# Patient Record
Sex: Female | Born: 1985 | Race: Black or African American | Hispanic: No | Marital: Single | Smoking: Current some day smoker
Health system: Southern US, Community
[De-identification: ages and names within clinical notes are randomized; demographics above are authoritative.]

## PROBLEM LIST (undated history)

## (undated) DIAGNOSIS — E282 Polycystic ovarian syndrome: Secondary | ICD-10-CM

## (undated) DIAGNOSIS — A749 Chlamydial infection, unspecified: Secondary | ICD-10-CM

## (undated) DIAGNOSIS — L409 Psoriasis, unspecified: Secondary | ICD-10-CM

## (undated) DIAGNOSIS — E119 Type 2 diabetes mellitus without complications: Secondary | ICD-10-CM

## (undated) DIAGNOSIS — A599 Trichomoniasis, unspecified: Secondary | ICD-10-CM

## (undated) DIAGNOSIS — R87629 Unspecified abnormal cytological findings in specimens from vagina: Secondary | ICD-10-CM

## (undated) DIAGNOSIS — A549 Gonococcal infection, unspecified: Secondary | ICD-10-CM

## (undated) DIAGNOSIS — I10 Essential (primary) hypertension: Secondary | ICD-10-CM

## (undated) HISTORY — PX: CHOLECYSTECTOMY: SHX55

## (undated) HISTORY — DX: Chlamydial infection, unspecified: A74.9

## (undated) HISTORY — DX: Unspecified abnormal cytological findings in specimens from vagina: R87.629

## (undated) HISTORY — DX: Gonococcal infection, unspecified: A54.9

## (undated) HISTORY — DX: Polycystic ovarian syndrome: E28.2

## (undated) HISTORY — DX: Psoriasis, unspecified: L40.9

## (undated) HISTORY — DX: Type 2 diabetes mellitus without complications: E11.9

## (undated) HISTORY — DX: Essential (primary) hypertension: I10

## (undated) HISTORY — DX: Trichomoniasis, unspecified: A59.9

---

## 2004-07-30 ENCOUNTER — Other Ambulatory Visit: Admission: RE | Admit: 2004-07-30 | Discharge: 2004-07-30 | Payer: Self-pay | Admitting: Obstetrics and Gynecology

## 2006-01-27 ENCOUNTER — Emergency Department (HOSPITAL_COMMUNITY): Admission: EM | Admit: 2006-01-27 | Discharge: 2006-01-27 | Payer: Self-pay | Admitting: Emergency Medicine

## 2006-11-01 ENCOUNTER — Emergency Department (HOSPITAL_COMMUNITY): Admission: EM | Admit: 2006-11-01 | Discharge: 2006-11-01 | Payer: Self-pay | Admitting: Emergency Medicine

## 2006-11-03 ENCOUNTER — Ambulatory Visit (HOSPITAL_COMMUNITY): Admission: RE | Admit: 2006-11-03 | Discharge: 2006-11-03 | Payer: Self-pay

## 2006-11-05 ENCOUNTER — Inpatient Hospital Stay (HOSPITAL_COMMUNITY): Admission: AD | Admit: 2006-11-05 | Discharge: 2006-11-05 | Payer: Self-pay | Admitting: Obstetrics & Gynecology

## 2007-06-07 ENCOUNTER — Emergency Department (HOSPITAL_COMMUNITY): Admission: EM | Admit: 2007-06-07 | Discharge: 2007-06-07 | Payer: Self-pay | Admitting: Emergency Medicine

## 2007-06-20 ENCOUNTER — Ambulatory Visit (HOSPITAL_COMMUNITY): Admission: RE | Admit: 2007-06-20 | Discharge: 2007-06-20 | Payer: Self-pay | Admitting: Obstetrics & Gynecology

## 2007-07-18 ENCOUNTER — Ambulatory Visit (HOSPITAL_COMMUNITY): Admission: RE | Admit: 2007-07-18 | Discharge: 2007-07-18 | Payer: Self-pay | Admitting: Obstetrics & Gynecology

## 2007-08-15 ENCOUNTER — Ambulatory Visit (HOSPITAL_COMMUNITY): Admission: RE | Admit: 2007-08-15 | Discharge: 2007-08-15 | Payer: Self-pay | Admitting: Obstetrics & Gynecology

## 2007-09-10 ENCOUNTER — Ambulatory Visit (HOSPITAL_COMMUNITY): Admission: RE | Admit: 2007-09-10 | Discharge: 2007-09-10 | Payer: Self-pay | Admitting: Obstetrics & Gynecology

## 2007-10-23 ENCOUNTER — Other Ambulatory Visit: Admission: RE | Admit: 2007-10-23 | Discharge: 2007-10-23 | Payer: Self-pay | Admitting: Obstetrics & Gynecology

## 2007-10-24 ENCOUNTER — Ambulatory Visit: Payer: Self-pay | Admitting: Obstetrics and Gynecology

## 2007-10-24 ENCOUNTER — Inpatient Hospital Stay (HOSPITAL_COMMUNITY): Admission: AD | Admit: 2007-10-24 | Discharge: 2007-10-26 | Payer: Self-pay | Admitting: Obstetrics & Gynecology

## 2008-01-29 ENCOUNTER — Inpatient Hospital Stay (HOSPITAL_COMMUNITY): Admission: EM | Admit: 2008-01-29 | Discharge: 2008-02-01 | Payer: Self-pay | Admitting: Emergency Medicine

## 2008-01-29 ENCOUNTER — Ambulatory Visit: Payer: Self-pay | Admitting: Internal Medicine

## 2008-01-31 ENCOUNTER — Encounter (INDEPENDENT_AMBULATORY_CARE_PROVIDER_SITE_OTHER): Payer: Self-pay | Admitting: General Surgery

## 2008-11-17 ENCOUNTER — Ambulatory Visit (HOSPITAL_COMMUNITY): Admission: RE | Admit: 2008-11-17 | Discharge: 2008-11-17 | Payer: Self-pay | Admitting: Family Medicine

## 2010-08-31 IMAGING — CR DG MANDIBLE 4+V
7 series · 7 of 7 positions shown · non-contrast
Comparison: None available.

CLINICAL DATA: Pain and left side of jaw.  Status post trauma.

MANDIBLE - 4+ VIEW

[view not recorded (1 of 7)]
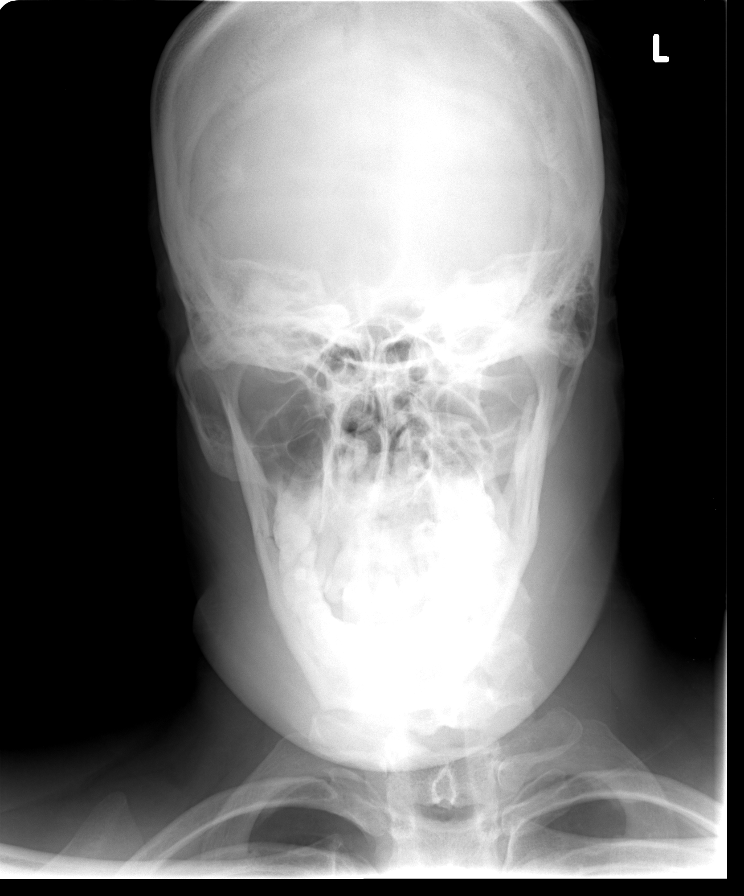

[view not recorded (2 of 7)]
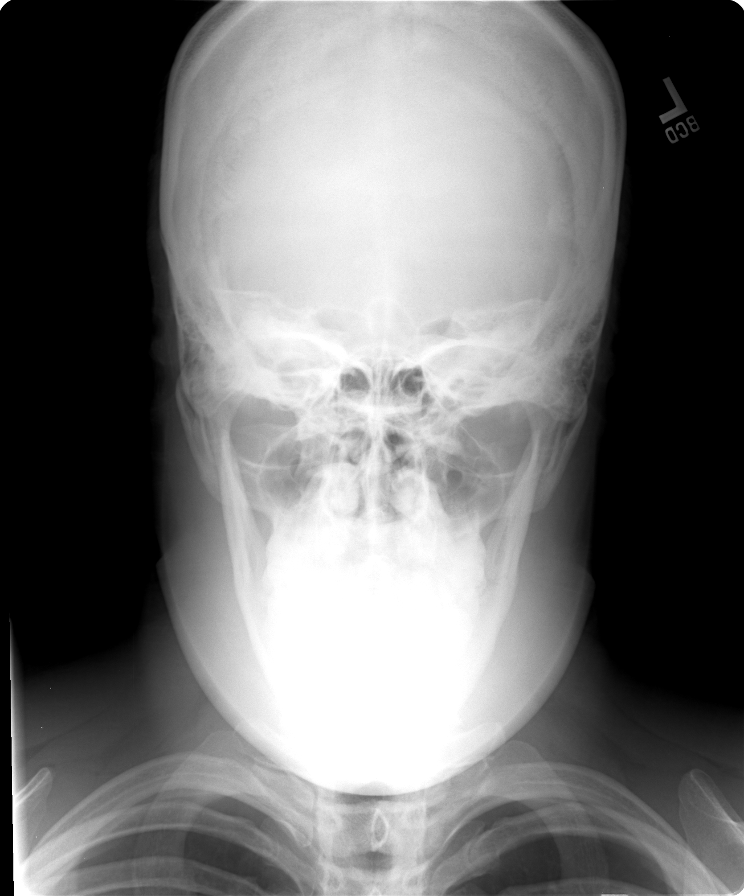

[view not recorded (3 of 7)]
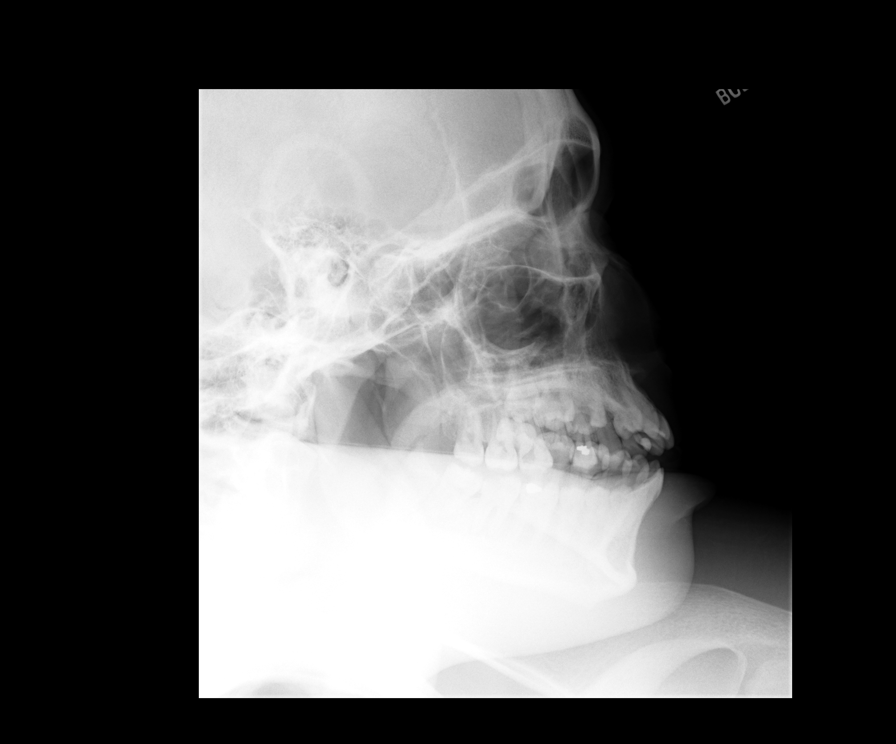

[view not recorded (4 of 7)]
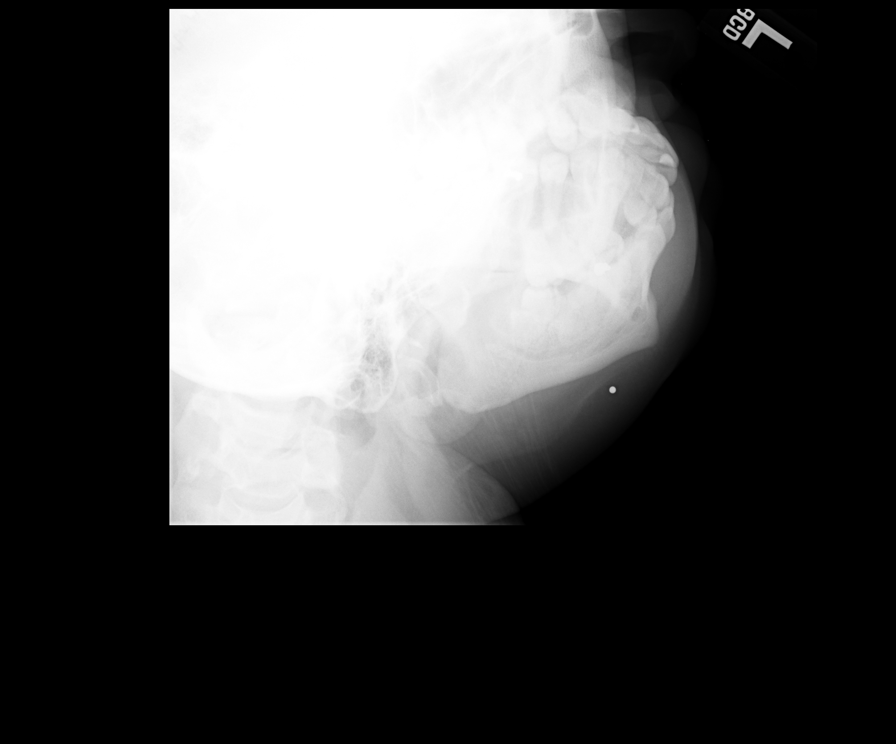

[view not recorded (5 of 7)]
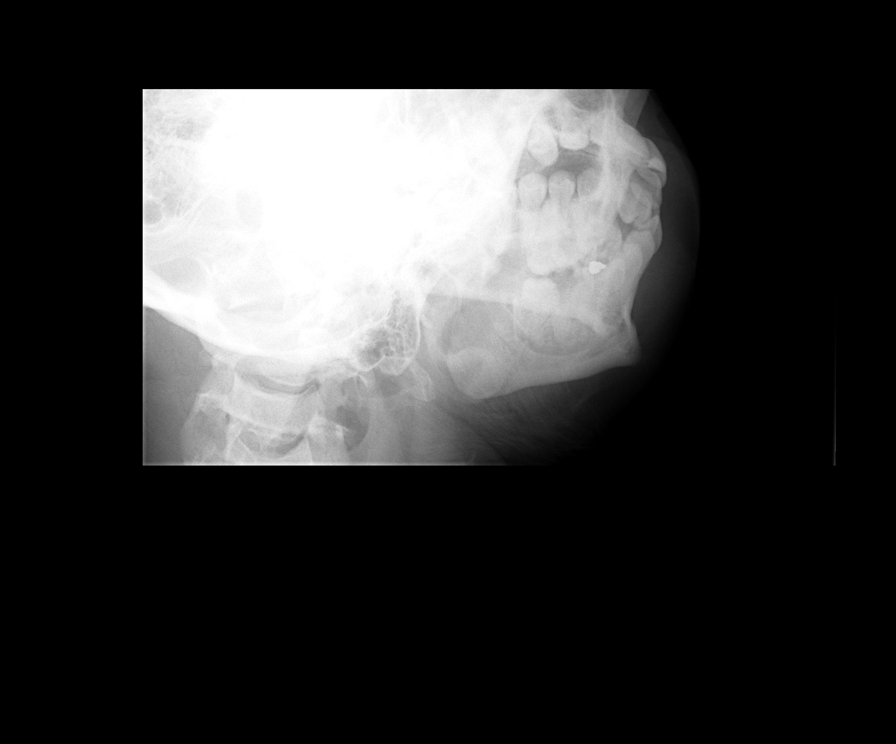

[view not recorded (6 of 7)]
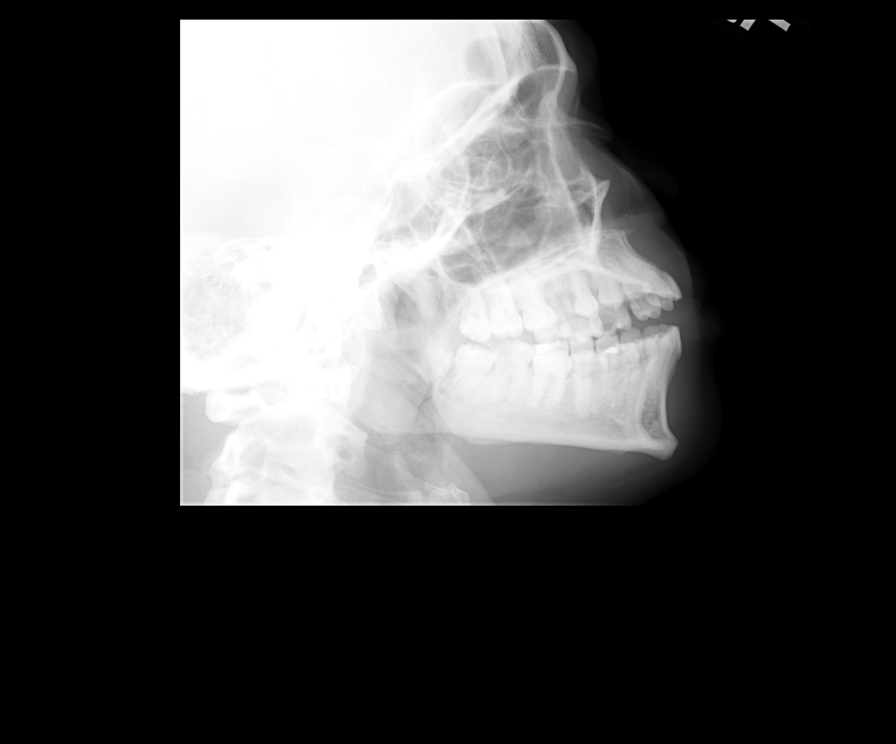

[view not recorded (7 of 7)]
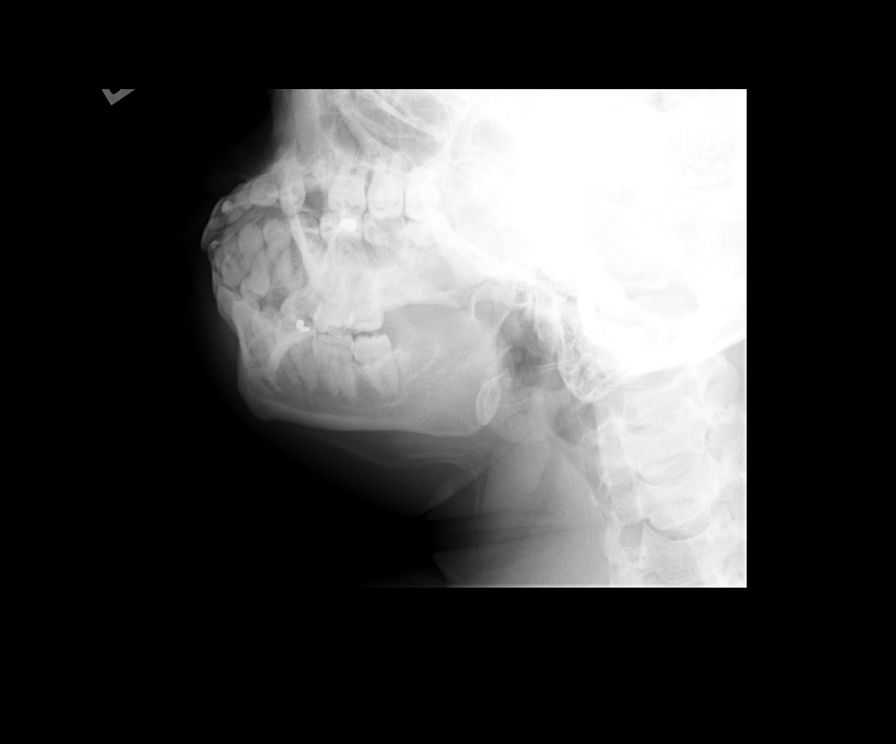

[7 of 7 positions shown; findings below may reference images not displayed]

FINDINGS: There is irregularity evident on the AP views
bilaterally.  This may be artifactual.  The study is limited due to
patient body habitus and technique.  No other definite fractures
seen.  Mandible is located bilaterally.
IMPRESSION: Linear irregularity at the angle mandible bilaterally on the AP
views is not confirmed on the other views.  Given the limitations
of the exam, CT may be of use for further evaluation if the
patient's pain persists.

## 2010-11-07 ENCOUNTER — Encounter: Payer: Self-pay | Admitting: Obstetrics & Gynecology

## 2010-11-07 ENCOUNTER — Encounter: Payer: Self-pay | Admitting: Family Medicine

## 2011-03-01 NOTE — H&P (Signed)
NAMEMARNEY, TRELOAR            ACCOUNT NO.:  [id number]   MEDICAL RECORD NO.:  [id number]          PATIENT TYPE:  INP   LOCATION:  A304                          FACILITY:  [facility]   PHYSICIAN:  Osvaldo Shipper, MD     DATE OF BIRTH:  09/20/1986   DATE OF ADMISSION:  01/29/2008  DATE OF DISCHARGE:  LH                              HISTORY & PHYSICAL   PRIMARY MEDICAL DOCTOR:  Dr. Simone Curia.   GYNECOLOGIST:  Dr. Emelda Fear.   ADMISSION DIAGNOSES:  1. Acute pancreatitis, possibly biliary in origin.  2. Obesity.   CHIEF COMPLAINT:  Abdominal pain since yesterday.   HISTORY OF PRESENT ILLNESS:  The patient is a 25 year old African  American female who is obese who delivered a baby girl in January of  this year.  She mentioned that through the last few months of her  pregnancy, she has been having on and off abdominal pains, especially in  the upper abdomen which I have been relieved by antacids.  Then she  experienced a couple more episodes of pain over the last couple of weeks  as well.  These symptoms progressively worsened yesterday when the pain  was 8-10/10 in intensity.  The pain was described as a dull aching pain  with radiation to the inferior part of her abdomen with no radiation to  the back.  There was no other associated symptoms of nausea and vomiting  x2.  No diarrhea.  No fever or chills.  This pain is not related to food  intake.  She took antacids with no relief.  No other precipitant,  aggravating or relieving factors were identified.  She says she continues to be sexually active, but she has an implant for  contraception.   MEDICATIONS AT HOME:  Currently none.   ALLERGIES:  NO KNOWN DRUG ALLERGIES.   PAST MEDICAL HISTORY:  She has polycystic ovarian syndrome.  Otherwise,  no other medical problems.  No surgeries in the past.   SOCIAL HISTORY:  Lives in [place] with her father.  She has one girl  child born in January of this year, normal delivery.   Half pack of  cigarettes on a daily basis is smoke by the patient.  She mentioned to  me that she has been drinking more than usual ever since her pregnancy.  She has been drinking about 4 days out of every 7 days in a week.  The  main consumption is beer with occasional vodka.  Her last alcohol  consumption was on Saturday.  Denied any illicit drug use.   FAMILY HISTORY:  Positive for gallbladder disease, otherwise, none.   REVIEW OF SYSTEMS:  GENERAL:  System positive for weakness, malaise.  HEENT:  Unremarkable.  CARDIOVASCULAR:  Remarkable.  RESPIRATORY:  Unremarkable.  GI: As in HPI.  GU: Unremarkable.  MUSCULOSKELETAL:  Unremarkable.  PSYCHIATRIC:  Unremarkable.  UROLOGICAL:  Unremarkable.  Other systems unremarkable.   PHYSICAL EXAMINATION:  VITAL SIGNS:  Temperature 97.0, blood pressure  116/65, heart rate 61, respiratory rate 18, saturation 99% on room air.  GENERAL:  An obese, African American female in no  distress.  HEENT:  There is no pallor, no icterus.  Oral mucous membranes moist.  No oral lesions are noted.  NECK:  Soft and supple.  No thyromegaly is appreciated.  LUNGS:  Clear to auscultation bilaterally.  No wheezing, rales or  rhonchi.  CARDIOVASCULAR:  S1, S2 normal, regular.  ABDOMEN:  Completely soft, benign, nontender, nondistended.  Bowel  sounds are present.  No mass or organomegaly is appreciated.  EXTREMITIES:  Show no edema.  Peripheral pulses are palpable.  NEUROLOGIC:  The patient is alert, oriented x3.  No focal neurologic  deficits are present.   LABORATORY DATA:  White count is normal hemoglobin 7.6, MCV is 85,  platelet count is 457.  C-met shows a glucose of 108, renal function was  normal.  AST is 198, ALT is 197.  Amylase is 958, lipase was 1745.   No imaging studies have been done.   ASSESSMENT:  This is a 25 year old African American female who is obese,  who is 3 months postpartum, who has currently having periods at this  point, who  presents with abdominal pain and is found to have evidence  for acute pancreatitis.  Etiology for this is likely gallstone  pancreatitis. She also has been consuming more alcohol than usual,  though at an amount which is less likely to cause pancreatitis, but it  is a possibility.   PLAN:  Acute pancreatitis.  We will admit the patient.  Start supportive  measures.  I will keep her n.p.o.  Get an ultrasound tomorrow which will  give more information on the gallbladder and will also evaluate the  pancreas.  Her obesity may impose problems with technicalities of the  ultrasound.  If that is the case, CT scan may be considered.  GI will be  consulted.  Dr. Gerda Diss will assume her care tomorrow.  She has been  counseled on alcohol use.  Her urine pregnancy test is pending.  Triglycerides will be checked as well.   Further management decisions will depend upon results of further testing  and patient's response to treatment.      Osvaldo Shipper, MD  Electronically Signed     GK/MEDQ  D:  01/29/2008  T:  01/29/2008  Job:  [id number]   cc:   Donna Bernard, M.D.  Fax: [phone]   Kassie Mends, M.D.  [address]  [place] , [place] [address]

## 2011-03-01 NOTE — Consult Note (Signed)
Tonya Bell, Tonya Bell            ACCOUNT NO.:  [id number]   MEDICAL RECORD NO.:  [id number]          PATIENT TYPE:  INP   LOCATION:  A304                          FACILITY:  [facility]   PHYSICIAN:  Tilford Pillar, MD      DATE OF BIRTH:  Mar 26, 1986   DATE OF CONSULTATION:  DATE OF DISCHARGE:                                 CONSULTATION   REASON FOR CONSULTATION:  1. Abdominal pain.  2. Cholelithiasis.  3. Acute pancreatitis.   HISTORY OF PRESENT ILLNESS:  The patient is an 25 year old African  American female who is recently postpartum  within the last 3 months who  presented with increasing epigastric and right upper quadrant pain.  She  has had similar symptomatology actually during her pregnancy in the  right upper quadrant, never this severe.  She had associated nausea.  She did not have any bowel changes noted with this.  She was admitted to  [facility] on January 29, 2008 for this pain for continued  management.  She was diagnosed acute pancreatitis and workup was  initiated.  At this point, her pain has actually resolved.  She denies  any epigastric pain.  She feels hungry.  She has had bowel function.  She is passing flatus.  She has no change in urination.  No dysuria.  No  hematuria.  She had no fever or chills.  She has no nausea.  In the  past, she has not had any history of jaundice.  She has never been  diagnosed previously with biliary disease.  She did state that her  symptoms did worsen with eating fatty or greasy foods, but the most  recent episode during her pregnancy worsened with green leafy vegetables  as well.  She does have a fairly significant family history of biliary  disease as well as she does have a history of Native American ancestry.   PAST MEDICAL HISTORY:  Unremarkable.   PAST SURGICAL HISTORY:  None.   MEDICATIONS:  She is not on any home medications.   ALLERGIES:  No known drug allergies.   SOCIAL HISTORY:  She is a half-a-pack a day  smoker.  She drinks  occasionally through the week, mainly beer.  No history of heavy alcohol  usage.  No recreational drug use.  She has not had any recent unusual  travels.   PHYSICAL EXAMINATION:  VITAL SIGNS:  Temperature is 98.3, heart rate 61,  respirations 20, and blood pressure 135/58.  GENERAL:  The patient actually walked into room prior to evaluation.  She does not appear to be in any acute distress.  She is easily able to  transition from standing to a supine position  in her  hospital bed.  She is alert and oriented x3.  HEENT:  Pupils equal, round, and reactive.  Extraocular movements are  intact.  No conjunctival pallor or scleral icterus noted.  Trachea is  midline.  NECK:  No cervical lymphadenopathy is appreciated.  PULMONARY:  Unlabored respirations.  She is clear to auscultation.  CARDIOVASCULAR:  Regular rate and rhythm.  ABDOMINAL:  Soft, obese, and nontender  to palpation.  She does not  elucidate Murphy sign.  She does not have any peritoneal signs.  No  masses or hernias are appreciated.  EXTREMITIES:  Warm and dry.   PERTINENT LABORATORY AND RADIOGRAPHIC STUDY:  CBC:  White blood cell  count 7.3, hemoglobin 11.2, hematocrit 34.4, and platelets 446.  Basic  metabolic panel:  Sodium 141, potassium 3.8, chloride 109, bicarb 27,  BUN 8, creatinine 0.91, and blood glucose 102.  Amylase 590, this is  down from 950 on her admission; lipase 396 down from 1700 on admission;  AST is elevated at 191; ALT is elevated 259; total bilirubin remaining  0.7, remainder of her laboratory evaluation is unremarkable.  She did  have a radiographic evaluation of the right upper quadrant demonstrating  a cholelithiasis.  There is no evidence of any acute cholecystitis or  pericholecystic fluid noted.   ASSESSMENT AND PLAN:  Acute pancreatitis.  At this point, based on the  patient's clinical presentation and history we suspect this is likely  gallstone  etiology of her  pancreatitis.  Based on these findings, I do  recommend continuing IV fluid hydration, continuing the patient in a  limited p.o. setting to avoid any exacerbation of the pancreatitis with  continued resolution of her pain and returns to normalization of her  amylase and lipase.  I will then recommend starting on clear liquids,  advancing as tolerated.  It was discussed with the patient the risks,  benefits, and alternatives of the laparoscopic possible open  cholecystectomy for further etiology of gallstone pancreatitis.  It was  discussed with the patient that I would recommend  to have this  performed prior to her discharge and that should she wish to proceed,  then we would advance her to a regular diet.  On demonstration of  tolerance of regular diet with no elevation of amylase and lipase for  return of her symptomatology at that point, she would then be placed in  n.p.o. status and scheduled  for an operation followup.   Discharge could be anticipated following her operation  pending  her  recovery from the procedure.  It was also discussed with the patient  possibility of an elective outpatient procedure, but again, the risk of  additional acute pancreatitis in the future could not be necessarily  avoided.  She wished to delay the cholecystectomy.  It was also  discussed with the patient her increased risk of having future bouts of  pancreatitis regardless of the gallbladder cholecystectomy based on the  pathophysiology of pancreatitis.  She understands this and understands  the benefits of cholecystectomy.  At this point, I will continue to  follow the patient's progress, and in case she continues to improve, as  she had over the last 24 hours, a laparoscopic cholecystectomy could  potentially occur in the next 48 to 72 hours.   I do appreciate the opportunity to participate in this patient's care.      Tilford Pillar, MD  Electronically Signed     BZ/MEDQ  D:  01/29/2008   T:  01/30/2008  Job:  [id number]   cc:   [facility]   W. Simone Curia, M.D.  Fax: [phone]

## 2011-03-01 NOTE — Op Note (Signed)
NAMEFOY, MUNGIA            ACCOUNT NO.:  [id number]   MEDICAL RECORD NO.:  [id number]          PATIENT TYPE:  INP   LOCATION:  A304                          FACILITY:  [facility]   PHYSICIAN:  Tilford Pillar, MD      DATE OF BIRTH:  06-May-1986   DATE OF PROCEDURE:  01/31/2008  DATE OF DISCHARGE:                               OPERATIVE REPORT   PREOPERATIVE DIAGNOSIS:  Gallstone pancreatitis.   POSTOPERATIVE DIAGNOSIS:  Gallstone pancreatitis.   PROCEDURES:  1. Laparoscopic cholecystectomy with intraoperative cholangiogram.  2. Intraoperative interpretation of fluoroscopy.   SURGEON:  Tilford Pillar, MD   ANESTHESIA:  General endotracheal local anesthetic, 1% Sensorcaine  plain.   SPECIMEN:  Gallbladder.   ESTIMATED BLOOD LOSS:  Minimal.   INDICATIONS:  The patient is a 25 year old female who was admitted to  [facility] with acute onset of epigastric abdominal pain.  She  was admitted to Dr. Gerda Diss and was continued to be evaluated.  She was  found to have acute pancreatitis, and based on her history, this was  determined to be a likely gallstone etiology.  She recovered from her  pancreatitis and was advanced on a diet, which she tolerated well with  no elevation in her enzymes.  The risks, benefits, and alternatives of a  laparoscopic possible open cholecystectomy as well as an intraoperative  cholangiogram were discussed at length with the patient including, but  not limited to the risks of bleeding, infection, bile leak, small bowel  injury, common bile duct injury, as well as the possibility of  intraoperative pulmonary or cardiac events.  The patient's questions and  concerns were addressed, and the patient was consented for the planned  procedure.   OPERATION:  The patient was taken to the operating room and placed in  supine position on the operating table, at which time the general  anesthetic was administered.  When the patient was asleep, she was  endotracheally intubated by anesthesia.  At this point, the patient had  her abdomen prepped and draped in the usual fashion.  A supraumbilical  incision was created with a scalpel.  Additional dissection down to the  subcuticular tissue was carried out using a Kocher clamp, which was  utilized to grasp the anterior abdominal wall fascia and move this  anteriorly.  A Veress needle was inserted.  Saline drop test was  utilized to confirm intraperitoneal placement, and then a  pneumoperitoneum was initiated.  Once sufficient pneumoperitoneum was  obtained, an 11-mm trocar was inserted over the laparoscope, allowing  visualization of the trocar entering into the peritoneum.  At this  point, the inner cannula was removed.  The laparoscope was reinserted.  There was no evidence of any trocar placement injury.  At this time, the  remaining trocars were placed including an 11-mm trocar in the  epigastrium, a 5-mm trocar in the midline between the two 11-mm trocars,  and a 5-mm trocar in the right lateral abdominal wall.  These were all  placed in a similar fashion with a skin incision and placement of the  trocars was under direct visualization.  At this point, the patient was  placed in a reverse Trendelenburg left lateral decubitus position.  Then, the fundus of the gallbladder was lifted up and over the liver,  and the peritoneum was bluntly stripped off the infundibulum allowing  exposure to the cystic duct.  The cystic duct was dissected free  circumferentially.  __________  was placed distally adjacent to the  infundibulum.  A small defect was created with EndoShears in the cystic  duct with a return of bile.  At this point, a cholangiogram catheter was  advanced and inserted.  This was flushed.  Good saline flush was  obtained.  No leaking was evident, and at this point, the fluoroscopy  was brought to the field.   At this point, a contrast was injected to perform the intraoperative   cholangiogram.  There was no evidence of any stones within the cystic  duct or in the common bile duct with good flow into the duodenum noted.  The biliary tree was visualized with retrograde flow into the liver as  well.  At this point, the C-arm was packed away from the field.  The  catheter was removed.  Two EndoClips were placed proximally, and the  division of the cystic duct was completed with the EndoShears.  At this  point, the cystic artery was identified.  Two EndoClips were placed  proximally and one distally, and the cystic artery was divided between  __________ .  At this point, electrocautery was utilized to dissect the  gallbladder free from the gallbladder fossa.  Once the gallbladder was  free, it was placed in the EndoCatch bag and was placed up and over the  right lobe of the liver.  At this point, the patient was returned to  flat position, and attention was turned to the placement of fascial  closure stitches.   At this point, using an Endoclose suture passing device, a 2-0 Vicryl  suture was passed through both the 11-mm trocar sites with these two  sutures in place.  A piece of Surgicel was placed into the gallbladder  fossa, and the EndoCatch bag was removed through the epigastric trocar  site.  Due to the presence of stones and the dilatation of the  gallbladder, blunt dilatation as well as some sharp dilatation with  enlargement of the trocar site was required in order to allow adequate  dilatation for removal of the gallbladder.  The gallbladder was removed  intact in the EndoCatch bag.  This was placed to the back table and sent  as permanent specimen to pathology.  At this point, the trocars were  removed and after allowing the pneumoperitoneum to be evacuated, the  Vicryl sutures were secured.  The local anesthetic was injected.  A 4-0  Monocryl was utilized to reapproximate the skin edges at all 4 trocar  sites.  The skin was washed and dried with a moist  and dry towel.  Benzoin was applied around the incision.  Half-inch Steri-Strips were  placed.  Then, the patient was allowed to come out of general  anesthetic.  She was transferred back to regular hospital bed and was  transferred to the postanesthetic care unit in stable condition.  At the  conclusion of the procedure, all instruments, sponge, and needle counts  were correct.  The patient tolerated the procedure well.      Tilford Pillar, MD  Electronically Signed     BZ/MEDQ  D:  01/31/2008  T:  02/01/2008  Job:  [id number]   cc:   Donna Bernard, M.D.  Fax: [phone]

## 2011-03-01 NOTE — Consult Note (Signed)
NAME:  Tonya Bell, Tonya Bell            ACCOUNT NO.:  [id number]   MEDICAL RECORD NO.:  [id number]          PATIENT TYPE:  INP   LOCATION:  A304                          FACILITY:  [facility]   PHYSICIAN:  R. Roetta Sessions, M.D. DATE OF BIRTH:  04/29/1986   DATE OF CONSULTATION:  01/29/2008  DATE OF DISCHARGE:                                 CONSULTATION   REQUESTING PHYSICIAN:  Dr. Osvaldo Shipper covering for Dr. Lubertha South.   REASON FOR CONSULTATION:  Pancreatitis.   HISTORY OF PRESENT ILLNESS:  The patient is a 25 year old African  American female who presented with acute-onset epigastric pain, nausea  and vomiting which occurred after eating a meal yesterday afternoon  around 3 p.m.  She states she had a fairly significant amount of food  including macaroni and cheese, roast, potatoes, etc, and lay down for a  nap.  She woke up from her nap with severe epigastric pain, nausea and  vomiting.  She had a couple of episode of vomiting.  Her pain was  severe.  She presented to the emergency department.  She is feeling  better already this morning.  She has not required any pain medication  since she was given some in the ED around 10:15 last night.  She has had  no further vomiting.  She says she has had a couple of similar episodes  while she was pregnant last year.  Her symptoms only lasted for about a  day.  She has had some reflux towards the end of her pregnancy, but none  now.  Her bowel movements have been regular.  No blood in the stool or  melena.  She admits that since the delivery of her daughter in January,  she has been drinking more frequently.  She generally has 1-2 beers a  day about 4 days a week; this usually occurs around the weekend.  Last  Saturday, she states she drank more than usual, which consisted of 2  beers and 2 shots of vodka.   MEDICATIONS AT HOME:  None.   ALLERGIES:  No known drug allergies.   PAST MEDICAL HISTORY:  Polycystic ovarian syndrome.   FAMILY  HISTORY:  Positive for gallbladder disease.   SOCIAL HISTORY:  She lives with her father.  Her daughter was born in  January 2009.  She smokes half a pack of cigarettes daily.  She consumes  1-2 beers daily about 4 days of the week, occasional vodka as well.  Her  last alcohol consumption was Saturday.  She denies any drug use.   REVIEW OF SYSTEMS:  GI:  See HPI.  CONSTITUTIONAL:  She states she is  slightly less than her prepregnancy weight at this point.  CARDIOPULMONARY:  No chest pain, shortness of breath, palpitations, or  cough.  GENITOURINARY:  No dysuria or hematuria.  She is currently  having her menstrual cycle, which seems to be regular.   PHYSICAL EXAMINATION:  VITAL SIGNS:  Temperature 98, pulse 74,  respirations 20, blood pressure 146/64.  Height 66 inches, weight 114.2  kg.  O2 SAT is 100% on room air.  GENERAL:  A please, obese black female in no acute distress.  SKIN:  Warm and dry, no jaundice.  HEENT:  Sclerae are anicteric.  Oropharyngeal mucosa moist and pink.  No  lesions, erythema or exudate.  No lymphadenopathy or thyromegaly.  CHEST:  Lungs are clear to auscultation.  CARDIAC:  Regular rate and rhythm.  Normal S1 and S2.  No murmurs, rubs,  or gallops.  ABDOMEN:  Positive bowel sounds.  Abdomen is obese but symmetrical and  soft.  She has minimal tenderness in the epigastric region to deep  palpation, no rebound or guarding, no abdominal bruits or hernias, no  hepatosplenomegaly or masses.  LOWER EXTREMITIES:  No edema.   LABORATORY AND ACCESSORY CLINICAL DATA:  On admission, her lipase was  1745; it is down to 396 today.  Her amylase was 950, down to 590 today.  Total bilirubin and alkaline phosphatase were normal; AST was 198, today  191; ALT was 197, today 259; albumin 3.2.  Calcium normal at 8.8.  Hemoglobin 11.2, platelets 446,000, white count 7300.  BUN 8, creatinine  0.91, glucose 102, potassium 3.8, sodium 141, triglycerides level is  pending.    Abdominal ultrasound is pending.   IMPRESSION:  The patient is a 25 year old lady who presents with acute  pancreatitis.  She delivered a healthy baby girl in January 2009.  High  on the differential would be possibility of biliary pancreatitis, given  that she has elevated transaminases and a reported limited daily alcohol  consumption.  Cannot exclude elevated transaminases due to fatty liver,  given recent pregnancy and obesity.   RECOMMENDATIONS:  1. Agree with ultrasound of the abdomen.  2. Repeat liver function tests in the morning.  3. We will keep her n.p.o. for now and provide supportive measures.      Depending on what her ultrasound shows, she may be able to have      clear liquids later this evening.  Further recommendations to      follow.   I would like to thank Dr. Osvaldo Shipper for allowing [place] to take part in  the care of this patient.      Tana Coast, P.AJonathon Bellows, M.D.  Electronically Signed    LL/MEDQ  D:  01/29/2008  T:  01/29/2008  Job:  [id number]   cc:   Donna Bernard, M.D.  Fax: [phone]   Osvaldo Shipper, MD

## 2011-03-04 NOTE — Discharge Summary (Signed)
Tonya Bell, Tonya Bell            ACCOUNT NO.:  [id number]   MEDICAL RECORD NO.:  [id number]          PATIENT TYPE:  INP   LOCATION:  A304                          FACILITY:  [facility]   PHYSICIAN:  Tilford Pillar, MD      DATE OF BIRTH:  January 06, 1986   DATE OF ADMISSION:  01/29/2008  DATE OF DISCHARGE:  04/17/2009LH                               DISCHARGE SUMMARY   ADMISSION DIAGNOSIS:  Acute pancreatitis.   DISCHARGE DIAGNOSES:  1. Status post gallstone pancreatitis.  2. Status post laparoscopic cholecystectomy.  3. Obesity.   ADMITTING SERVICE:  [facility] hospitalist.   CONSULTATIONS:  Surgical consultation by myself and GI consultation.   DISPOSITION:  Home.   PROCEDURES:  Laparoscopic cholecystectomy 02/01/08.   DISPOSITION:  Home.   BRIEF HISTORY AND PHYSICAL:  Please see the admission history and  physical for complete H&P.  The patient presented to [facility]  Emergency Department with acute onset of epigastric abdominal pain.  She  was evaluated in the emergency department, and was determined to have  acute pancreatitis.  She was admitted for continued management and  intervention as well as continued workup for etiology.   HOSPITAL COURSE:  The patient was admitted on 01/29/08.  Her  symptomatology continued to improve.  She was determined to have  gallstones on a right upper quadrant ultrasound, which revealed suspect  etiology of pancreatitis.  Her enzymes were in the normal.  At this  point she was started back on a diet.  She was advanced from clears to  regular diet as tolerated, at this point tolerating her diet.  It was  discussed with the patient the risks, benefits, and alternatives of  laparoscopic possible open cholecystectomy.  She understood the risks,  benefits, and alternatives and wished to proceed.  She was taken to the  operating room on the morning of 02/01/08 tolerated the procedure well,  and she was discharged following her hospitalization.  After  following  her operation, she had a brief period in the Postanesthetic Care Unit,  was transferred back to floor where she was continued and advanced back  to regular diet.  She was tolerating a regular diet.  Pain was  controlled on oral pain medicine.  She was ambulatory and plans were  made for discharge to home.   DISCHARGE INSTRUCTIONS:  1. The patient is to follow up in my office.  2. She is also to follow up with her primary care physician, Dr.      Gerda Diss.  3. She is to increase her activity slowly.  4. She may resume a normal diet.  5. She is not to lift anything greater than 20 pounds for the next 4      weeks.  6. She is not to drive while taking pain medications.   DISCHARGE MEDICATIONS:  1. The patient instructed to resume all previously prescribed home      medications.  2. Lortab 5/500, one to two p.o. q.4 h. p.r.n. pain.      Tilford Pillar, MD  Electronically Signed     BZ/MEDQ  D:  02/21/2008  T:  02/22/2008  Job:  [id number]

## 2011-07-07 LAB — CBC
Hemoglobin: 9.6 — ABNORMAL LOW
MCHC: 33.8
Platelets: 614 — ABNORMAL HIGH
RBC: 3.22 — ABNORMAL LOW
RBC: 3.73 — ABNORMAL LOW
WBC: 10.9 — ABNORMAL HIGH
WBC: 12.3 — ABNORMAL HIGH

## 2011-07-07 LAB — RPR: RPR Ser Ql: NONREACTIVE

## 2011-07-12 LAB — HEPATIC FUNCTION PANEL
AST: 22
Bilirubin, Direct: 0.1

## 2011-07-12 LAB — DIFFERENTIAL
Basophils Absolute: 0.2 — ABNORMAL HIGH
Basophils Absolute: 0.1
Basophils Relative: 2 — ABNORMAL HIGH
Basophils Relative: 1
Eosinophils Absolute: 0.4
Eosinophils Absolute: 0.4
Eosinophils Relative: 5
Eosinophils Relative: 9 — ABNORMAL HIGH
Lymphs Abs: 2
Lymphs Abs: 2.3
Monocytes Absolute: 0.6
Monocytes Absolute: 0.8
Monocytes Relative: 8
Monocytes Relative: 9
Neutro Abs: 3.8
Neutrophils Relative %: 53

## 2011-07-12 LAB — COMPREHENSIVE METABOLIC PANEL
ALT: 259 — ABNORMAL HIGH
AST: 51 — ABNORMAL HIGH
Albumin: 3.1 — ABNORMAL LOW
Alkaline Phosphatase: 72
BUN: 9
CO2: 27
Calcium: 8.8
Calcium: 9.4
Chloride: 106
Chloride: 108
Creatinine, Ser: 1.03
GFR calc Af Amer: >60
GFR calc Af Amer: >60
Glucose, Bld: 102 — ABNORMAL HIGH
Glucose, Bld: 108 — ABNORMAL HIGH
Potassium: 4
Potassium: 3.7
Sodium: 141
Total Bilirubin: 0.7
Total Protein: 6.2

## 2011-07-12 LAB — CBC
Hemoglobin: 11.2 — ABNORMAL LOW
MCHC: 33.4
MCHC: 33.5
MCV: 85.8
Platelets: 446 — ABNORMAL HIGH
Platelets: 457 — ABNORMAL HIGH
Platelets: 426 — ABNORMAL HIGH
RDW: 16.8 — ABNORMAL HIGH
RDW: 16.8 — ABNORMAL HIGH
WBC: 7.6

## 2011-07-12 LAB — AMYLASE
Amylase: 590 — ABNORMAL HIGH
Amylase: 950 — ABNORMAL HIGH
Amylase: 114

## 2011-07-12 LAB — PROTIME-INR
INR: 1.1
INR: 1
Prothrombin Time: 14.2

## 2011-07-12 LAB — APTT: aPTT: 30

## 2011-07-12 LAB — TRIGLYCERIDES: Triglycerides: 34

## 2011-07-12 LAB — PREGNANCY, URINE: Preg Test, Ur: NEGATIVE

## 2011-07-12 LAB — HEPATITIS PANEL, ACUTE: Hep A IgM: NEGATIVE

## 2013-06-30 ENCOUNTER — Encounter (HOSPITAL_COMMUNITY): Payer: Self-pay | Admitting: *Deleted

## 2013-06-30 ENCOUNTER — Emergency Department (HOSPITAL_COMMUNITY)
Admission: EM | Admit: 2013-06-30 | Discharge: 2013-06-30 | Disposition: A | Discharge status: Home / Self Care | Payer: Self-pay | Attending: Emergency Medicine | Admitting: Emergency Medicine

## 2013-06-30 DIAGNOSIS — Y990 Civilian activity done for income or pay: Secondary | ICD-10-CM | POA: Insufficient documentation

## 2013-06-30 DIAGNOSIS — Y9289 Other specified places as the place of occurrence of the external cause: Secondary | ICD-10-CM | POA: Insufficient documentation

## 2013-06-30 DIAGNOSIS — W2209XA Striking against other stationary object, initial encounter: Secondary | ICD-10-CM | POA: Insufficient documentation

## 2013-06-30 DIAGNOSIS — S0512XA Contusion of eyeball and orbital tissues, left eye, initial encounter: Secondary | ICD-10-CM

## 2013-06-30 DIAGNOSIS — S0510XA Contusion of eyeball and orbital tissues, unspecified eye, initial encounter: Secondary | ICD-10-CM | POA: Insufficient documentation

## 2013-06-30 DIAGNOSIS — F17200 Nicotine dependence, unspecified, uncomplicated: Secondary | ICD-10-CM | POA: Insufficient documentation

## 2013-06-30 DIAGNOSIS — H2000 Unspecified acute and subacute iridocyclitis: Secondary | ICD-10-CM | POA: Insufficient documentation

## 2013-06-30 DIAGNOSIS — Y9389 Activity, other specified: Secondary | ICD-10-CM | POA: Insufficient documentation

## 2013-06-30 MED ORDER — KETOROLAC TROMETHAMINE 0.5 % OP SOLN: 1.0000 [drp] | Freq: Four times a day (QID) | OPHTHALMIC | Status: DC

## 2013-06-30 MED ORDER — TETRACAINE HCL 0.5 % OP SOLN
1.0000 [drp] | Freq: Once | OPHTHALMIC | Status: AC
  Administered 2013-06-30: 1 [drp] via OPHTHALMIC
  Filled 2013-06-30: qty 2

## 2013-06-30 MED ORDER — FLUORESCEIN SODIUM 1 MG OP STRP
ORAL_STRIP | OPHTHALMIC | Status: AC
  Filled 2013-06-30: qty 1

## 2013-06-30 MED ORDER — IBUPROFEN 600 MG PO TABS: 600.0000 mg | ORAL_TABLET | Freq: Four times a day (QID) | ORAL | Status: DC | PRN

## 2013-06-30 NOTE — ED Provider Notes (Signed)
Medical screening examination/treatment/procedure(s) were conducted as a shared visit with non-physician practitioner(s) and myself.  I personally evaluated the patient during the encounter.  Patient is likely traumatic iritis. No evidence of abrasion my exam. She reports no change in visual acuity. Does not wear contact lenses. Pain treatment for presumed traumatic iritis. Return precautions discussed.   Raeford Razor, MD 06/30/13 [phone]

## 2013-06-30 NOTE — ED Provider Notes (Signed)
CSN: [id number]     Arrival date & time 06/30/13  1012 History   First MD Initiated Contact with Patient 06/30/13 1026     Chief Complaint  Patient presents with  . Eye Injury   (Consider location/radiation/quality/duration/timing/severity/associated sxs/prior Treatment) Patient is a 27 y.o. female presenting with eye injury. The history is provided by the patient.  Eye Injury This is a new problem. The current episode started in the past 7 days. The problem has been unchanged. Pertinent negatives include no abdominal pain, chills, coughing, fever, headaches, nausea, neck pain, visual change or vomiting. She has tried nothing for the symptoms.   Tonya Bell is a 27 y.o. female who presents to the ED with left eye injury from 2 days ago. Patient states she was at work and changing a spool of thread on a large metal spool and when she pulled it up the metal part hit her left eye. She has had tenderness and watery eye, photophobia and pain with movement of eye since the injury.   History reviewed. No pertinent past medical history. Past Surgical History  Procedure Laterality Date  . Cholecystectomy     No family history on file. History  Substance Use Topics  . Smoking status: Current Every Day Smoker    Types: Cigarettes  . Smokeless tobacco: Not on file  . Alcohol Use: Yes     Comment: Occ   OB History   Grav Para Term Preterm Abortions TAB SAB Ect Mult Living                 Review of Systems  Constitutional: Negative for fever and chills.  HENT: Negative for facial swelling and neck pain.   Eyes: Positive for photophobia and pain. Negative for itching and visual disturbance. Eye discharge: watery.  Respiratory: Negative for cough.   Gastrointestinal: Negative for nausea, vomiting and abdominal pain.  Genitourinary: Negative for urgency.  Musculoskeletal: Negative for back pain.  Skin: Negative for wound.  Neurological: Negative for headaches.    Psychiatric/Behavioral: The patient is not nervous/anxious.     Allergies  Review of patient's allergies indicates no known allergies.  Home Medications  No current outpatient prescriptions on file. BP 120/62  Pulse 67  Temp(Src) 98.4 F (36.9 C) (Oral)  Resp 16  SpO2 99%  LMP 06/02/2013 Physical Exam  Nursing note and vitals reviewed. Constitutional: She is oriented to person, place, and time. She appears well-developed and well-nourished. No distress.  HENT:  Head: Normocephalic.  Eyes: EOM are normal. Pupils are equal, round, and reactive to light. Left eye exhibits no discharge and no hordeolum. No foreign body present in the left eye. Left conjunctiva is injected.  Fundoscopic exam:      The left eye shows no hemorrhage.  Slit lamp exam:      The left eye shows no corneal abrasion, no foreign body, no hyphema and no fluorescein uptake.    There is tenderness with palpation of the upper left lid. No ecchymosis noted. Minimal swelling.   Neck: Neck supple.  Cardiovascular: Normal rate.   Pulmonary/Chest: Effort normal.  Abdominal: Soft. There is no tenderness.  Musculoskeletal: Normal range of motion.  Neurological: She is alert and oriented to person, place, and time. No cranial nerve deficit.  Skin: Skin is warm and dry.  Psychiatric: She has a normal mood and affect. Her behavior is normal.    ED Course: Dr. Juleen China in to examine patient using slit lamp, agrees with my assessment.  Procedures   MDM  27 y.o. female with contusion to the right eye and iritis. Will treat with Acular opth drops and she is to follow up with Dr. Lita Mains (opthomology) she will return here as needed.  Discussed with the patient and all questioned fully answered. She is stable for discharge home without any immediate complications.    Medication List         ketorolac 0.5 % ophthalmic solution  Commonly known as:  ACULAR  Place 1 drop into both eyes every 6 (six) hours.          Janne Napoleon, [place] 06/30/13 1158

## 2013-06-30 NOTE — ED Notes (Signed)
Pt states she hit her left eye with a metal spindle while at work on Friday night. Began having pain and sensitivity to light with watering, yesterday.

## 2017-05-04 ENCOUNTER — Encounter: Payer: Self-pay | Admitting: Adult Health

## 2017-05-04 ENCOUNTER — Ambulatory Visit (INDEPENDENT_AMBULATORY_CARE_PROVIDER_SITE_OTHER): Payer: Medicaid Other | Admitting: Adult Health

## 2017-05-04 VITALS — BP 130/80 | HR 76 | Ht 65.0 in | Wt 288.0 lb

## 2017-05-04 DIAGNOSIS — O3680X0 Pregnancy with inconclusive fetal viability, not applicable or unspecified: Secondary | ICD-10-CM

## 2017-05-04 DIAGNOSIS — Z3201 Encounter for pregnancy test, result positive: Secondary | ICD-10-CM

## 2017-05-04 DIAGNOSIS — N926 Irregular menstruation, unspecified: Secondary | ICD-10-CM | POA: Diagnosis not present

## 2017-05-04 DIAGNOSIS — Z3490 Encounter for supervision of normal pregnancy, unspecified, unspecified trimester: Secondary | ICD-10-CM

## 2017-05-04 LAB — POCT URINE PREGNANCY: PREG TEST UR: POSITIVE — AB

## 2017-05-04 MED ORDER — PRENATAL PLUS 27-1 MG PO TABS: 1.0000 | ORAL_TABLET | Freq: Every day | ORAL | 12 refills | Status: DC

## 2017-05-04 NOTE — Progress Notes (Signed)
Subjective:     Patient ID: Tonya Bell, female   DOB: 1986-04-01, 31 y.o.   MRN: [id number]  Tonya Bell is a 31 year old black female in for UPT, has missed a period.   Review of Systems +missed period  Reviewed past medical,surgical, social and family history. Reviewed medications and allergies.     Objective:   Physical Exam BP 130/80 (BP Location: Left Arm, Patient Position: Sitting, Cuff Size: Large)   Pulse 76   Ht 5\' 5"  (1.651 m)   Wt 288 lb (130.6 kg)   LMP 03/15/2017   BMI 47.93 kg/m UPT +, about 7+1 week by LMP with EDD 12/21/17.Skin warm and dry. Neck: mid line trachea, normal thyroid, good ROM, no lymphadenopathy noted. Lungs: clear to ausculation bilaterally. Cardiovascular: regular rate and rhythm.Abdomen is soft and non tender.PHQ 2 score 0.    Assessment:     1. Pregnancy test positive   2. Pregnancy, unspecified gestational age   35. Encounter to determine fetal viability of pregnancy, single or unspecified fetus       Plan:     Return in 1 week for dating [place]  Meds ordered this encounter  Medications  . prenatal vitamin w/FE, FA (PRENATAL 1 + 1) 27-1 MG TABS tablet    Sig: Take 1 tablet by mouth daily at 12 noon.    Dispense:  30 each    Refill:  12    Order Specific Question:   Supervising Provider    Answer:   Florian Buff [[id number]]  Review handout by [facility]

## 2017-05-10 ENCOUNTER — Ambulatory Visit (INDEPENDENT_AMBULATORY_CARE_PROVIDER_SITE_OTHER): Payer: Medicaid Other

## 2017-05-10 DIAGNOSIS — O3680X0 Pregnancy with inconclusive fetal viability, not applicable or unspecified: Secondary | ICD-10-CM

## 2017-05-10 NOTE — Progress Notes (Signed)
[place] 7+1 wks,single IUP w/ys,positive FHT 153 bpm,normal left ovary,simple right corpus luteal cyst 2.7 x 2.4 x 2.4 cm,crl 10.5 mm,EDD 12/26/2017

## 2017-05-24 ENCOUNTER — Encounter: Payer: Self-pay | Admitting: Advanced Practice Midwife

## 2017-05-24 ENCOUNTER — Other Ambulatory Visit (HOSPITAL_COMMUNITY)
Admission: RE | Admit: 2017-05-24 | Discharge: 2017-05-24 | Disposition: A | Discharge status: Home / Self Care | Payer: Medicaid Other | Source: Ambulatory Visit | Attending: Advanced Practice Midwife | Admitting: Advanced Practice Midwife

## 2017-05-24 ENCOUNTER — Ambulatory Visit (INDEPENDENT_AMBULATORY_CARE_PROVIDER_SITE_OTHER): Payer: Medicaid Other | Admitting: Advanced Practice Midwife

## 2017-05-24 VITALS — BP 120/60 | HR 90 | Wt 290.0 lb

## 2017-05-24 DIAGNOSIS — O99321 Drug use complicating pregnancy, first trimester: Secondary | ICD-10-CM | POA: Diagnosis not present

## 2017-05-24 DIAGNOSIS — F1290 Cannabis use, unspecified, uncomplicated: Secondary | ICD-10-CM | POA: Diagnosis not present

## 2017-05-24 DIAGNOSIS — Z124 Encounter for screening for malignant neoplasm of cervix: Secondary | ICD-10-CM | POA: Insufficient documentation

## 2017-05-24 DIAGNOSIS — O99331 Smoking (tobacco) complicating pregnancy, first trimester: Secondary | ICD-10-CM

## 2017-05-24 DIAGNOSIS — Z3682 Encounter for antenatal screening for nuchal translucency: Secondary | ICD-10-CM | POA: Diagnosis not present

## 2017-05-24 DIAGNOSIS — Z1389 Encounter for screening for other disorder: Secondary | ICD-10-CM | POA: Diagnosis not present

## 2017-05-24 DIAGNOSIS — Z331 Pregnant state, incidental: Secondary | ICD-10-CM

## 2017-05-24 DIAGNOSIS — Z3A09 9 weeks gestation of pregnancy: Secondary | ICD-10-CM | POA: Diagnosis not present

## 2017-05-24 DIAGNOSIS — F17200 Nicotine dependence, unspecified, uncomplicated: Secondary | ICD-10-CM | POA: Insufficient documentation

## 2017-05-24 DIAGNOSIS — Z720 Tobacco use: Secondary | ICD-10-CM

## 2017-05-24 DIAGNOSIS — Z3481 Encounter for supervision of other normal pregnancy, first trimester: Secondary | ICD-10-CM

## 2017-05-24 DIAGNOSIS — O0990 Supervision of high risk pregnancy, unspecified, unspecified trimester: Secondary | ICD-10-CM | POA: Insufficient documentation

## 2017-05-24 LAB — POCT URINALYSIS DIPSTICK
Glucose, UA: NEGATIVE
KETONES UA: NEGATIVE
LEUKOCYTES UA: NEGATIVE
Nitrite, UA: NEGATIVE
PROTEIN UA: NEGATIVE
RBC UA: NEGATIVE

## 2017-05-24 NOTE — Progress Notes (Signed)
  Subjective:    Tonya Bell is a [id number] [redacted]w[redacted]d being seen today for her first obstetrical visit.  Her obstetrical history is significant for smoker.now 3/day.  Stop smoking strategies discussed  Not smoking J anymore either.  Pregnancy history fully reviewed. Had term SVD w/o problems Patient reports no complaints.  Vitals:   05/24/17 1112  BP: 120/60  Pulse: 90  Weight: 290 lb (131.5 kg)    HISTORY: OB History  Gravida Para Term Preterm AB Living  4 1 1   2 1   SAB TAB Ectopic Multiple Live Births    2     1    # Outcome Date GA Lbr Len/2nd Weight Sex Delivery Anes PTL Lv  4 Current           3 Term 10/24/07 [redacted]w[redacted]d  7 lb 6 oz (3.345 kg) F Vag-Spont EPI N LIV  2 TAB 2005          1 TAB 2004             Past Medical History:  Diagnosis Date  . Chlamydia   . Gonorrhea   . PCOS (polycystic ovarian syndrome)   . Psoriasis   . Trichomonas infection   . Vaginal Pap smear, abnormal    Past Surgical History:  Procedure Laterality Date  . CHOLECYSTECTOMY     Family History  Problem Relation Age of Onset  . Hypertension Father   . Kidney disease Mother   . Hypertension Mother   . Leukemia Paternal Grandfather   . Hypertension Paternal Grandfather   . Diabetes Paternal Grandmother   . Hypertension Maternal Uncle   . Heart attack Maternal Uncle   . Diabetes Paternal Aunt   . Hypertension Paternal Aunt   . COPD Paternal Uncle   . Hypertension Paternal Uncle   . Diabetes Paternal Uncle      Exam       Pelvic Exam:    Perineum: Normal Perineum   Vulva: normal   Vagina:  normal mucosa, normal discharge, no palpable nodules   Uterus Normal, Gravid, FH: 9     Cervix: normal   Adnexa: Not palpable   Urinary:  urethral meatus normal    System:     Skin: normal coloration and turgor, no rashes    Neurologic: oriented, normal, normal mood   Extremities: normal strength, tone, and muscle mass   HEENT PERRLA   Mouth/Teeth mucous membranes moist, normal  dentition   Neck supple and no masses   Cardiovascular: regular rate and rhythm   Respiratory:  appears well, vitals normal, no respiratory distress, acyanotic   Abdomen: soft, non-tender;  FHR: 160US          Assessment:    Pregnancy: [id number] Patient Active Problem List   Diagnosis Date Noted  . Supervision of normal pregnancy 05/24/2017  . Smoker 05/24/2017  . Marijuana use 05/24/2017        Plan:     Initial labs drawn. Continue prenatal vitamins  Problem list reviewed and updated  Reviewed n/v relief measures and warning s/s to report  Reviewed recommended weight gain based on pre-gravid BMI  Encouraged well-balanced diet Genetic Screening discussed Integrated Screen: requested.  Ultrasound discussed; fetal survey: requested.  Return in about 3 weeks (around 06/14/2017) for LROB, US:NT+1st IT.  CRESENZO-DISHMAN,Cleo Villamizar 05/24/2017

## 2017-05-24 NOTE — Patient Instructions (Signed)
 First Trimester of Pregnancy The first trimester of pregnancy is from week 1 until the end of week 12 (months 1 through 3). A week after a sperm fertilizes an egg, the egg will implant on the wall of the uterus. This embryo will begin to develop into a baby. Genes from you and your partner are forming the baby. The female genes determine whether the baby is a boy or a girl. At 6-8 weeks, the eyes and face are formed, and the heartbeat can be seen on ultrasound. At the end of 12 weeks, all the baby's organs are formed.  Now that you are pregnant, you will want to do everything you can to have a healthy baby. Two of the most important things are to get good prenatal care and to follow your health care provider's instructions. Prenatal care is all the medical care you receive before the baby's birth. This care will help prevent, find, and treat any problems during the pregnancy and childbirth. BODY CHANGES Your body goes through many changes during pregnancy. The changes vary from woman to woman.   You may gain or lose a couple of pounds at first.  You may feel sick to your stomach (nauseous) and throw up (vomit). If the vomiting is uncontrollable, call your health care provider.  You may tire easily.  You may develop headaches that can be relieved by medicines approved by your health care provider.  You may urinate more often. Painful urination may mean you have a bladder infection.  You may develop heartburn as a result of your pregnancy.  You may develop constipation because certain hormones are causing the muscles that push waste through your intestines to slow down.  You may develop hemorrhoids or swollen, bulging veins (varicose veins).  Your breasts may begin to grow larger and become tender. Your nipples may stick out more, and the tissue that surrounds them (areola) may become darker.  Your gums may bleed and may be sensitive to brushing and flossing.  Dark spots or blotches  (chloasma, mask of pregnancy) may develop on your face. This will likely fade after the baby is born.  Your menstrual periods will stop.  You may have a loss of appetite.  You may develop cravings for certain kinds of food.  You may have changes in your emotions from day to day, such as being excited to be pregnant or being concerned that something may go wrong with the pregnancy and baby.  You may have more vivid and strange dreams.  You may have changes in your hair. These can include thickening of your hair, rapid growth, and changes in texture. Some women also have hair loss during or after pregnancy, or hair that feels dry or thin. Your hair will most likely return to normal after your baby is born. WHAT TO EXPECT AT YOUR PRENATAL VISITS During a routine prenatal visit:  You will be weighed to make sure you and the baby are growing normally.  Your blood pressure will be taken.  Your abdomen will be measured to track your baby's growth.  The fetal heartbeat will be listened to starting around week 10 or 12 of your pregnancy.  Test results from any previous visits will be discussed. Your health care provider may ask you:  How you are feeling.  If you are feeling the baby move.  If you have had any abnormal symptoms, such as leaking fluid, bleeding, severe headaches, or abdominal cramping.  If you have any questions. Other   tests that may be performed during your first trimester include:  Blood tests to find your blood type and to check for the presence of any previous infections. They will also be used to check for low iron levels (anemia) and Rh antibodies. Later in the pregnancy, blood tests for diabetes will be done along with other tests if problems develop.  Urine tests to check for infections, diabetes, or protein in the urine.  An ultrasound to confirm the proper growth and development of the baby.  An amniocentesis to check for possible genetic problems.  Fetal  screens for spina bifida and Down syndrome.  You may need other tests to make sure you and the baby are doing well. HOME CARE INSTRUCTIONS  Medicines  Follow your health care provider's instructions regarding medicine use. Specific medicines may be either safe or unsafe to take during pregnancy.  Take your prenatal vitamins as directed.  If you develop constipation, try taking a stool softener if your health care provider approves. Diet  Eat regular, well-balanced meals. Choose a variety of foods, such as meat or vegetable-based protein, fish, milk and low-fat dairy products, vegetables, fruits, and whole grain breads and cereals. Your health care provider will help you determine the amount of weight gain that is right for you.  Avoid raw meat and uncooked cheese. These carry germs that can cause birth defects in the baby.  Eating four or five small meals rather than three large meals a day may help relieve nausea and vomiting. If you start to feel nauseous, eating a few soda crackers can be helpful. Drinking liquids between meals instead of during meals also seems to help nausea and vomiting.  If you develop constipation, eat more high-fiber foods, such as fresh vegetables or fruit and whole grains. Drink enough fluids to keep your urine clear or pale yellow. Activity and Exercise  Exercise only as directed by your health care provider. Exercising will help you:  Control your weight.  Stay in shape.  Be prepared for labor and delivery.  Experiencing pain or cramping in the lower abdomen or low back is a good sign that you should stop exercising. Check with your health care provider before continuing normal exercises.  Try to avoid standing for long periods of time. Move your legs often if you must stand in one place for a long time.  Avoid heavy lifting.  Wear low-heeled shoes, and practice good posture.  You may continue to have sex unless your health care provider directs you  otherwise. Relief of Pain or Discomfort  Wear a good support bra for breast tenderness.   Take warm sitz baths to soothe any pain or discomfort caused by hemorrhoids. Use hemorrhoid cream if your health care provider approves.   Rest with your legs elevated if you have leg cramps or low back pain.  If you develop varicose veins in your legs, wear support hose. Elevate your feet for 15 minutes, 3-4 times a day. Limit salt in your diet. Prenatal Care  Schedule your prenatal visits by the twelfth week of pregnancy. They are usually scheduled monthly at first, then more often in the last 2 months before delivery.  Write down your questions. Take them to your prenatal visits.  Keep all your prenatal visits as directed by your health care provider. Safety  Wear your seat belt at all times when driving.  Make a list of emergency phone numbers, including numbers for family, friends, the hospital, and police and fire departments. General   Tips  Ask your health care provider for a referral to a local prenatal education class. Begin classes no later than at the beginning of month 6 of your pregnancy.  Ask for help if you have counseling or nutritional needs during pregnancy. Your health care provider can offer advice or refer you to specialists for help with various needs.  Do not use hot tubs, steam rooms, or saunas.  Do not douche or use tampons or scented sanitary pads.  Do not cross your legs for long periods of time.  Avoid cat litter boxes and soil used by cats. These carry germs that can cause birth defects in the baby and possibly loss of the fetus by miscarriage or stillbirth.  Avoid all smoking, herbs, alcohol, and medicines not prescribed by your health care provider. Chemicals in these affect the formation and growth of the baby.  Schedule a dentist appointment. At home, brush your teeth with a soft toothbrush and be gentle when you floss. SEEK MEDICAL CARE IF:   You have  dizziness.  You have mild pelvic cramps, pelvic pressure, or nagging pain in the abdominal area.  You have persistent nausea, vomiting, or diarrhea.  You have a bad smelling vaginal discharge.  You have pain with urination.  You notice increased swelling in your face, hands, legs, or ankles. SEEK IMMEDIATE MEDICAL CARE IF:   You have a fever.  You are leaking fluid from your vagina.  You have spotting or bleeding from your vagina.  You have severe abdominal cramping or pain.  You have rapid weight gain or loss.  You vomit blood or material that looks like coffee grounds.  You are exposed to German measles and have never had them.  You are exposed to fifth disease or chickenpox.  You develop a severe headache.  You have shortness of breath.  You have any kind of trauma, such as from a fall or a car accident. Document Released: 09/27/2001 Document Revised: 02/17/2014 Document Reviewed: 08/13/2013 Exit[employer] Patient Information 2015 [employer]. This information is not intended to replace advice given to you by your health care provider. Make sure you discuss any questions you have with your health care provider.   Nausea & Vomiting  Have saltine crackers or pretzels by your bed and eat a few bites before you raise your head out of bed in the morning  Eat small frequent meals throughout the day instead of large meals  Drink plenty of fluids throughout the day to stay hydrated, just don't drink a lot of fluids with your meals.  This can make your stomach fill up faster making you feel sick  Do not brush your teeth right after you eat  Products with real ginger are good for nausea, like ginger ale and ginger hard candy Make sure it says made with real ginger!  Sucking on sour candy like lemon heads is also good for nausea  If your prenatal vitamins make you nauseated, take them at night so you will sleep through the nausea  Sea Bands  If you feel like you need  medicine for the nausea & vomiting please let us know  If you are unable to keep any fluids or food down please let us know   Constipation  Drink plenty of fluid, preferably water, throughout the day  Eat foods high in fiber such as fruits, vegetables, and grains  Exercise, such as walking, is a good way to keep your bowels regular  Drink warm fluids, especially warm   prune juice, or decaf coffee  Eat a 1/2 cup of real oatmeal (not instant), 1/2 cup applesauce, and 1/2-1 cup warm prune juice every day  If needed, you may take Colace (docusate sodium) stool softener once or twice a day to help keep the stool soft. If you are pregnant, wait until you are out of your first trimester (12-14 weeks of pregnancy)  If you still are having problems with constipation, you may take Miralax once daily as needed to help keep your bowels regular.  If you are pregnant, wait until you are out of your first trimester (12-14 weeks of pregnancy)  Safe Medications in Pregnancy   Acne: Benzoyl Peroxide Salicylic Acid  Backache/Headache: Tylenol: 2 regular strength every 4 hours OR              2 Extra strength every 6 hours  Colds/Coughs/Allergies: Benadryl (alcohol free) 25 mg every 6 hours as needed Breath right strips Claritin Cepacol throat lozenges Chloraseptic throat spray Cold-Eeze- up to three times per day Cough drops, alcohol free Flonase (by prescription only) Guaifenesin Mucinex Robitussin DM (plain only, alcohol free) Saline nasal spray/drops Sudafed (pseudoephedrine) & Actifed ** use only after [redacted] weeks gestation and if you do not have high blood pressure Tylenol Vicks Vaporub Zinc lozenges Zyrtec   Constipation: Colace Ducolax suppositories Fleet enema Glycerin suppositories Metamucil Milk of magnesia Miralax Senokot Smooth move tea  Diarrhea: Kaopectate Imodium A-D  *NO pepto Bismol  Hemorrhoids: Anusol Anusol HC Preparation  H Tucks  Indigestion: Tums Maalox Mylanta Zantac  Pepcid  Insomnia: Benadryl (alcohol free) 25mg every 6 hours as needed Tylenol PM Unisom, no Gelcaps  Leg Cramps: Tums MagGel  Nausea/Vomiting:  Bonine Dramamine Emetrol Ginger extract Sea bands Meclizine  Nausea medication to take during pregnancy:  Unisom (doxylamine succinate 25 mg tablets) Take one tablet daily at bedtime. If symptoms are not adequately controlled, the dose can be increased to a maximum recommended dose of two tablets daily (1/2 tablet in the morning, 1/2 tablet mid-afternoon and one at bedtime). Vitamin B6 100mg tablets. Take one tablet twice a day (up to 200 mg per day).  Skin Rashes: Aveeno products Benadryl cream or 25mg every 6 hours as needed Calamine Lotion 1% cortisone cream  Yeast infection: Gyne-lotrimin 7 Monistat 7   **If taking multiple medications, please check labels to avoid duplicating the same active ingredients **take medication as directed on the label ** Do not exceed 4000 mg of tylenol in 24 hours **Do not take medications that contain aspirin or ibuprofen      

## 2017-05-25 ENCOUNTER — Encounter: Payer: Self-pay | Admitting: Advanced Practice Midwife

## 2017-05-25 ENCOUNTER — Other Ambulatory Visit: Payer: Self-pay | Admitting: Advanced Practice Midwife

## 2017-05-25 DIAGNOSIS — D473 Essential (hemorrhagic) thrombocythemia: Secondary | ICD-10-CM | POA: Insufficient documentation

## 2017-05-25 DIAGNOSIS — D75839 Thrombocytosis, unspecified: Secondary | ICD-10-CM | POA: Insufficient documentation

## 2017-05-25 DIAGNOSIS — A749 Chlamydial infection, unspecified: Secondary | ICD-10-CM | POA: Insufficient documentation

## 2017-05-25 LAB — URINALYSIS, ROUTINE W REFLEX MICROSCOPIC
Bilirubin, UA: NEGATIVE
GLUCOSE, UA: NEGATIVE
KETONES UA: NEGATIVE
Nitrite, UA: NEGATIVE
PROTEIN UA: NEGATIVE
RBC, UA: NEGATIVE
SPEC GRAV UA: 1.023 (ref 1.005–1.030)
UUROB: 0.2 mg/dL (ref 0.2–1.0)
pH, UA: 5.5 (ref 5.0–7.5)

## 2017-05-25 LAB — PMP SCREEN PROFILE (10S), URINE
Amphetamine Scrn, Ur: NEGATIVE ng/mL
BARBITURATE SCREEN URINE: NEGATIVE ng/mL
BENZODIAZEPINE SCREEN, URINE: NEGATIVE ng/mL
CANNABINOIDS UR QL SCN: POSITIVE ng/mL — AB
COCAINE(METAB.)SCREEN, URINE: NEGATIVE ng/mL
Creatinine(Crt), U: 188.1 mg/dL (ref 20.0–300.0)
METHADONE SCREEN, URINE: NEGATIVE ng/mL
OPIATE SCREEN URINE: NEGATIVE ng/mL
OXYCODONE+OXYMORPHONE UR QL SCN: NEGATIVE ng/mL
PROPOXYPHENE SCREEN URINE: NEGATIVE ng/mL
Ph of Urine: 5.9 (ref 4.5–8.9)
Phencyclidine Qn, Ur: NEGATIVE ng/mL

## 2017-05-25 LAB — CBC
HEMATOCRIT: 35.6 % (ref 34.0–46.6)
Hemoglobin: 11.5 g/dL (ref 11.1–15.9)
MCH: 29.9 pg (ref 26.6–33.0)
MCHC: 32.3 g/dL (ref 31.5–35.7)
MCV: 93 fL (ref 79–97)
PLATELETS: 646 10*3/uL — AB (ref 150–379)
RBC: 3.85 x10E6/uL (ref 3.77–5.28)
RDW: 13.8 % (ref 12.3–15.4)
WBC: 14.3 10*3/uL — AB (ref 3.4–10.8)

## 2017-05-25 LAB — VARICELLA ZOSTER ANTIBODY, IGG: Varicella zoster IgG: 3803 index (ref >165)

## 2017-05-25 LAB — MICROSCOPIC EXAMINATION: Casts: NONE SEEN /lpf

## 2017-05-25 LAB — HIV ANTIBODY (ROUTINE TESTING W REFLEX): HIV SCREEN 4TH GENERATION: NONREACTIVE

## 2017-05-25 LAB — RPR: RPR Ser Ql: NONREACTIVE

## 2017-05-25 LAB — RUBELLA SCREEN: Rubella Antibodies, IGG: 10.7 index (ref >0.99)

## 2017-05-25 LAB — CYTOLOGY - PAP
Adequacy: ABSENT
Chlamydia: POSITIVE — AB
DIAGNOSIS: NEGATIVE
HPV (WINDOPATH): NOT DETECTED
NEISSERIA GONORRHEA: NEGATIVE

## 2017-05-25 LAB — HEPATITIS B SURFACE ANTIGEN: Hepatitis B Surface Ag: NEGATIVE

## 2017-05-25 LAB — ANTIBODY SCREEN: ANTIBODY SCREEN: NEGATIVE

## 2017-05-25 LAB — SICKLE CELL SCREEN: Sickle Cell Screen: NEGATIVE

## 2017-05-25 LAB — ABO/RH: Rh Factor: POSITIVE

## 2017-05-25 MED ORDER — AZITHROMYCIN 500 MG PO TABS: 1000.0000 mg | ORAL_TABLET | Freq: Once | ORAL | 0 refills | Status: AC

## 2017-05-25 NOTE — Progress Notes (Signed)
Azithromycin for CHL.  Will notify pt tomorrow and offer expedited partner tx for partners

## 2017-05-26 ENCOUNTER — Other Ambulatory Visit: Payer: Self-pay | Admitting: Advanced Practice Midwife

## 2017-05-26 ENCOUNTER — Telehealth: Payer: Self-pay | Admitting: *Deleted

## 2017-05-26 LAB — URINE CULTURE

## 2017-05-26 MED ORDER — NITROFURANTOIN MONOHYD MACRO 100 MG PO CAPS: 100.0000 mg | ORAL_CAPSULE | Freq: Two times a day (BID) | ORAL | 0 refills | Status: DC

## 2017-05-26 NOTE — Progress Notes (Unsigned)
macrobid for gram neg UTI

## 2017-05-26 NOTE — Progress Notes (Signed)
macrobid fior gram neg UTI
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
## 2017-06-14 ENCOUNTER — Ambulatory Visit (INDEPENDENT_AMBULATORY_CARE_PROVIDER_SITE_OTHER): Payer: Medicaid Other | Admitting: Advanced Practice Midwife

## 2017-06-14 ENCOUNTER — Encounter: Payer: Self-pay | Admitting: Advanced Practice Midwife

## 2017-06-14 ENCOUNTER — Encounter (INDEPENDENT_AMBULATORY_CARE_PROVIDER_SITE_OTHER): Payer: Self-pay

## 2017-06-14 ENCOUNTER — Ambulatory Visit (INDEPENDENT_AMBULATORY_CARE_PROVIDER_SITE_OTHER): Payer: Medicaid Other

## 2017-06-14 VITALS — BP 148/82 | HR 104 | Wt 294.0 lb

## 2017-06-14 DIAGNOSIS — D473 Essential (hemorrhagic) thrombocythemia: Secondary | ICD-10-CM

## 2017-06-14 DIAGNOSIS — Z1389 Encounter for screening for other disorder: Secondary | ICD-10-CM

## 2017-06-14 DIAGNOSIS — O09891 Supervision of other high risk pregnancies, first trimester: Secondary | ICD-10-CM

## 2017-06-14 DIAGNOSIS — Z3682 Encounter for antenatal screening for nuchal translucency: Secondary | ICD-10-CM

## 2017-06-14 DIAGNOSIS — A749 Chlamydial infection, unspecified: Secondary | ICD-10-CM

## 2017-06-14 DIAGNOSIS — Z331 Pregnant state, incidental: Secondary | ICD-10-CM

## 2017-06-14 DIAGNOSIS — D75839 Thrombocytosis, unspecified: Secondary | ICD-10-CM

## 2017-06-14 DIAGNOSIS — Z3A12 12 weeks gestation of pregnancy: Secondary | ICD-10-CM | POA: Diagnosis not present

## 2017-06-14 DIAGNOSIS — O98811 Other maternal infectious and parasitic diseases complicating pregnancy, first trimester: Secondary | ICD-10-CM

## 2017-06-14 DIAGNOSIS — Z3481 Encounter for supervision of other normal pregnancy, first trimester: Secondary | ICD-10-CM

## 2017-06-14 DIAGNOSIS — Z3402 Encounter for supervision of normal first pregnancy, second trimester: Secondary | ICD-10-CM

## 2017-06-14 LAB — POCT URINALYSIS DIPSTICK
Glucose, UA: NEGATIVE
Ketones, UA: NEGATIVE
Leukocytes, UA: NEGATIVE
NITRITE UA: NEGATIVE
Protein, UA: NEGATIVE
RBC UA: NEGATIVE

## 2017-06-14 NOTE — Progress Notes (Addendum)
[id number] [redacted]w[redacted]d Estimated Date of Delivery: 12/26/17  Blood pressure (!) 148/82, pulse (!) 104, weight 294 lb (133.4 kg), last menstrual period 03/15/2017.   BP weight and urine results all reviewed and noted.  Never heard of thrombocytosis. Will ask mom about family hx  Please refer to the obstetrical flow sheet for the fundal height and fetal heart rate documentation:  [place] 12+1 wks,measurements c/w dates,crl 61.81 mm,normal ovaries bilat,fhr 169 bpm,NB present,NT 1.3 mm,post pl gr 0  Patient denies any bleeding and no rupture of membranes symptoms or regular contractions. Patient is without complaints. All questions were answered.  Partner treated for [facility]  Orders Placed This Encounter  Procedures  . GC/Chlamydia Probe Amp  . Maternal Screen, Integrated #1  . POCT urinalysis dipstick    Plan:  Continued routine obstetrical care,   Return in about 4 weeks (around 07/12/2017) for 2nd IT, LROB.

## 2017-06-14 NOTE — Progress Notes (Signed)
[place] 12+1 wks,measurements c/w dates,crl 61.81 mm,normal ovaries bilat,fhr 169 bpm,NB present,NT 1.3 mm,post pl gr 0

## 2017-06-14 NOTE — Patient Instructions (Signed)
Thrombocytosis

## 2017-06-16 LAB — GC/CHLAMYDIA PROBE AMP
Chlamydia trachomatis, NAA: NEGATIVE
Neisseria gonorrhoeae by PCR: NEGATIVE

## 2017-06-17 LAB — INTEGRATED 1
CROWN RUMP LENGTH MAT SCREEN: 61.8 mm
GEST. AGE ON COLLECTION DATE: 12.6 wk
MATERNAL AGE AT EDD: 32.3 a
NUMBER OF FETUSES: 1
Nuchal Translucency (NT): 1.3 mm
PAPP-A Value: 234.3 ng/mL
WEIGHT: 294 [lb_av]

## 2017-07-12 ENCOUNTER — Encounter: Payer: Self-pay | Admitting: Advanced Practice Midwife

## 2017-07-12 ENCOUNTER — Ambulatory Visit (INDEPENDENT_AMBULATORY_CARE_PROVIDER_SITE_OTHER): Payer: Medicaid Other | Admitting: Advanced Practice Midwife

## 2017-07-12 VITALS — BP 148/62 | HR 93 | Wt 292.0 lb

## 2017-07-12 DIAGNOSIS — Z1389 Encounter for screening for other disorder: Secondary | ICD-10-CM

## 2017-07-12 DIAGNOSIS — O10012 Pre-existing essential hypertension complicating pregnancy, second trimester: Secondary | ICD-10-CM | POA: Diagnosis not present

## 2017-07-12 DIAGNOSIS — Z3A16 16 weeks gestation of pregnancy: Secondary | ICD-10-CM | POA: Diagnosis not present

## 2017-07-12 DIAGNOSIS — Z1379 Encounter for other screening for genetic and chromosomal anomalies: Secondary | ICD-10-CM | POA: Diagnosis not present

## 2017-07-12 DIAGNOSIS — Z23 Encounter for immunization: Secondary | ICD-10-CM | POA: Diagnosis not present

## 2017-07-12 DIAGNOSIS — O10919 Unspecified pre-existing hypertension complicating pregnancy, unspecified trimester: Secondary | ICD-10-CM | POA: Insufficient documentation

## 2017-07-12 DIAGNOSIS — Z3482 Encounter for supervision of other normal pregnancy, second trimester: Secondary | ICD-10-CM

## 2017-07-12 DIAGNOSIS — F1290 Cannabis use, unspecified, uncomplicated: Secondary | ICD-10-CM | POA: Diagnosis not present

## 2017-07-12 DIAGNOSIS — Z363 Encounter for antenatal screening for malformations: Secondary | ICD-10-CM | POA: Diagnosis not present

## 2017-07-12 DIAGNOSIS — Z331 Pregnant state, incidental: Secondary | ICD-10-CM

## 2017-07-12 DIAGNOSIS — O99322 Drug use complicating pregnancy, second trimester: Secondary | ICD-10-CM | POA: Diagnosis not present

## 2017-07-12 DIAGNOSIS — O10019 Pre-existing essential hypertension complicating pregnancy, unspecified trimester: Secondary | ICD-10-CM

## 2017-07-12 LAB — POCT URINALYSIS DIPSTICK
Glucose, UA: NEGATIVE
KETONES UA: NEGATIVE
LEUKOCYTES UA: NEGATIVE
Nitrite, UA: NEGATIVE
Protein, UA: NEGATIVE
RBC UA: NEGATIVE

## 2017-07-12 MED ORDER — ASPIRIN EC 81 MG PO TBEC: 81.0000 mg | DELAYED_RELEASE_TABLET | Freq: Every day | ORAL | 6 refills | Status: DC

## 2017-07-12 NOTE — Patient Instructions (Signed)

## 2017-07-12 NOTE — Progress Notes (Signed)
[id number] [redacted]w[redacted]d Estimated Date of Delivery: 12/26/17  Blood pressure (!) 148/62, pulse 93, weight 292 lb (132.5 kg), last menstrual period 03/15/2017.   BP weight and urine results all reviewed and noted. 132/78 160/68  Pt stressed, but because she had ^ BP last visit, technically she meets criteria for CHTN at this time. Will start ASA.   Stopped smoking MJ.   Please refer to the obstetrical flow sheet for the fundal height and fetal heart rate documentation:  Patient denies any bleeding and no rupture of membranes symptoms or regular contractions. Patient is without complaints. All questions were answered.  Orders Placed This Encounter  Procedures  . US OB Comp + 14 Wk  . INTEGRATED 2  . POCT urinalysis dipstick    Plan:  Continued routine obstetrical care, 2nd IT  Return in about 3 weeks (around 08/02/2017) for [place], MB:TDHRCBU.

## 2017-07-17 LAB — INTEGRATED 2
AFP MARKER: 16.9 ng/mL
AFP MoM: 0.85
CROWN RUMP LENGTH: 61.8 mm
DIA MOM: 0.29
DIA VALUE: 33.3 pg/mL
ESTRIOL UNCONJUGATED: 0.51 ng/mL
GESTATIONAL AGE: 16.6 wk
Gest. Age on Collection Date: 12.6 weeks
HCG MOM: 0.73
Maternal Age at EDD: 32.3 yr
NUCHAL TRANSLUCENCY (NT): 1.3 mm
NUCHAL TRANSLUCENCY MOM: 0.83
Number of Fetuses: 1
PAPP-A MOM: 0.58
PAPP-A Value: 234.3 ng/mL
TEST RESULTS: NEGATIVE
Weight: 292 [lb_av]
Weight: 294 [lb_av]
hCG Value: 12.7 IU/mL
uE3 MoM: 0.74

## 2017-08-02 ENCOUNTER — Ambulatory Visit (INDEPENDENT_AMBULATORY_CARE_PROVIDER_SITE_OTHER): Payer: Medicaid Other

## 2017-08-02 ENCOUNTER — Encounter: Payer: Self-pay | Admitting: Women's Health

## 2017-08-02 ENCOUNTER — Ambulatory Visit (INDEPENDENT_AMBULATORY_CARE_PROVIDER_SITE_OTHER): Payer: Medicaid Other | Admitting: Women's Health

## 2017-08-02 VITALS — BP 128/74 | HR 79 | Wt 292.0 lb

## 2017-08-02 DIAGNOSIS — A749 Chlamydial infection, unspecified: Secondary | ICD-10-CM

## 2017-08-02 DIAGNOSIS — O0992 Supervision of high risk pregnancy, unspecified, second trimester: Secondary | ICD-10-CM

## 2017-08-02 DIAGNOSIS — O10019 Pre-existing essential hypertension complicating pregnancy, unspecified trimester: Secondary | ICD-10-CM

## 2017-08-02 DIAGNOSIS — D75839 Thrombocytosis, unspecified: Secondary | ICD-10-CM

## 2017-08-02 DIAGNOSIS — Z363 Encounter for antenatal screening for malformations: Secondary | ICD-10-CM | POA: Diagnosis not present

## 2017-08-02 DIAGNOSIS — Z1389 Encounter for screening for other disorder: Secondary | ICD-10-CM

## 2017-08-02 DIAGNOSIS — Z331 Pregnant state, incidental: Secondary | ICD-10-CM | POA: Diagnosis not present

## 2017-08-02 DIAGNOSIS — D473 Essential (hemorrhagic) thrombocythemia: Secondary | ICD-10-CM

## 2017-08-02 DIAGNOSIS — Z3482 Encounter for supervision of other normal pregnancy, second trimester: Secondary | ICD-10-CM

## 2017-08-02 DIAGNOSIS — Z3A19 19 weeks gestation of pregnancy: Secondary | ICD-10-CM | POA: Diagnosis not present

## 2017-08-02 DIAGNOSIS — Z3402 Encounter for supervision of normal first pregnancy, second trimester: Secondary | ICD-10-CM

## 2017-08-02 DIAGNOSIS — O98812 Other maternal infectious and parasitic diseases complicating pregnancy, second trimester: Secondary | ICD-10-CM | POA: Diagnosis not present

## 2017-08-02 LAB — POCT URINALYSIS DIPSTICK
Blood, UA: NEGATIVE
GLUCOSE UA: NEGATIVE
KETONES UA: NEGATIVE
LEUKOCYTES UA: NEGATIVE
NITRITE UA: NEGATIVE
Protein, UA: NEGATIVE

## 2017-08-02 NOTE — Progress Notes (Signed)
[place] 58+5 wks,cephalic,post pl gr 0,normal ovaries bilat,fhr 159 bpm,cx 3.3 cm,svp of fluid 3.4 cm,EFW 267 bpm,limited view of upper lip and nose because of fetal position,no obvious abnormalities

## 2017-08-02 NOTE — Patient Instructions (Signed)

## 2017-08-02 NOTE — Progress Notes (Signed)
   [name] name: Tonya Bell MRN [id number]  Date of birth: May 25, 1986 Chief Complaint:   Routine Prenatal Visit (u/s today)  History of Present Illness:   Tonya Bell is a 31 y.o. [phone] female at [redacted]w[redacted]d with an Estimated Date of Delivery: 12/26/17 being seen today for ongoing management of a high-risk pregnancy complicated by chronic HTN- no meds.  Today she reports no complaints.  . Vag. Bleeding: None.  Movement: Present. denies leaking of fluid.  Review of Systems:   Pertinent items are noted in HPI Denies abnormal vaginal discharge w/ itching/odor/irritation, headaches, visual changes, shortness of breath, chest pain, abdominal pain, severe nausea/vomiting, or problems with urination or bowel movements unless otherwise stated above. Pertinent History Reviewed:  Reviewed past medical,surgical, social, obstetrical and family history.  Reviewed problem list, medications and allergies. Physical Assessment:   Vitals:   08/02/17 0922  BP: 128/74  Pulse: 79  Weight: 292 lb (132.5 kg)  Body mass index is 48.59 kg/m.           Physical Examination:   General appearance: alert, well appearing, and in no distress  Mental status: alert, oriented to person, place, and time  Skin: warm & dry   Extremities: Edema: None    Cardiovascular: normal heart rate noted   Respiratory: normal respiratory effort, no distress   Abdomen: gravid, soft, non-tender   Pelvic: Cervical exam deferred    Fetal Status: Fetal Heart Rate (bpm): 159 u/s   Movement: Present   Fetal Surveillance Testing today:  Anatomy u/s: [place] 13+2 wks,cephalic,post pl gr 0,normal ovaries bilat,fhr 159 bpm,cx 3.3 cm,svp of fluid 3.4 cm,EFW 267 bpm,limited view of upper lip and nose because of fetal position,no obvious abnormalities  Results for orders placed or performed in visit on 08/02/17 (from the past 24 hour(s))  POCT urinalysis dipstick   Collection Time: 08/02/17  9:24 AM  Result  Value Ref Range   Color, UA     Clarity, UA     Glucose, UA neg    Bilirubin, UA     Ketones, UA neg    Spec Grav, UA  1.010 - 1.025   Blood, UA neg    pH, UA  5.0 - 8.0   Protein, UA neg    Urobilinogen, UA  0.2 or 1.0 E.U./dL   Nitrite, UA neg    Leukocytes, UA Negative Negative    Assessment & Plan:  1) High-risk pregnancy [id number] at [redacted]w[redacted]d with an Estimated Date of Delivery: 12/26/17   2) CHTN-no meds, stable, continue baby ASA  Labs/procedures today: anatomy u/s  Treatment Plan:   Growth u/s @ 20, 28, 34, 38wks     2x/wk testing nst/sono @ 32wks    Deliver @ 40wks (no meds), 39wks (meds)  Reviewed: normal nt/it results, anatomy u/s,  Preterm labor symptoms and general obstetric precautions including but not limited to vaginal bleeding, contractions, leaking of fluid and fetal movement were reviewed in detail with the patient.  All questions were answered.  Follow-up: Return in about 4 weeks (around 08/30/2017) for HROB, US:OB F/U anatomy.  Orders Placed This Encounter  Procedures  . US OB Follow Up  . POCT urinalysis dipstick   [name] CNM, [facility] 08/02/2017 9:45 AM

## 2017-08-30 ENCOUNTER — Ambulatory Visit (INDEPENDENT_AMBULATORY_CARE_PROVIDER_SITE_OTHER): Payer: Medicaid Other

## 2017-08-30 ENCOUNTER — Ambulatory Visit (INDEPENDENT_AMBULATORY_CARE_PROVIDER_SITE_OTHER): Payer: Medicaid Other | Admitting: Advanced Practice Midwife

## 2017-08-30 VITALS — BP 124/70 | HR 68 | Wt 296.0 lb

## 2017-08-30 DIAGNOSIS — Z331 Pregnant state, incidental: Secondary | ICD-10-CM | POA: Diagnosis not present

## 2017-08-30 DIAGNOSIS — O10019 Pre-existing essential hypertension complicating pregnancy, unspecified trimester: Secondary | ICD-10-CM

## 2017-08-30 DIAGNOSIS — D473 Essential (hemorrhagic) thrombocythemia: Secondary | ICD-10-CM

## 2017-08-30 DIAGNOSIS — O10012 Pre-existing essential hypertension complicating pregnancy, second trimester: Secondary | ICD-10-CM

## 2017-08-30 DIAGNOSIS — A749 Chlamydial infection, unspecified: Secondary | ICD-10-CM

## 2017-08-30 DIAGNOSIS — Z3A23 23 weeks gestation of pregnancy: Secondary | ICD-10-CM | POA: Diagnosis not present

## 2017-08-30 DIAGNOSIS — Z1389 Encounter for screening for other disorder: Secondary | ICD-10-CM

## 2017-08-30 DIAGNOSIS — D75839 Thrombocytosis, unspecified: Secondary | ICD-10-CM

## 2017-08-30 DIAGNOSIS — Z363 Encounter for antenatal screening for malformations: Secondary | ICD-10-CM

## 2017-08-30 DIAGNOSIS — Z3482 Encounter for supervision of other normal pregnancy, second trimester: Secondary | ICD-10-CM

## 2017-08-30 DIAGNOSIS — Z3402 Encounter for supervision of normal first pregnancy, second trimester: Secondary | ICD-10-CM

## 2017-08-30 DIAGNOSIS — O0992 Supervision of high risk pregnancy, unspecified, second trimester: Secondary | ICD-10-CM

## 2017-08-30 LAB — POCT URINALYSIS DIPSTICK
Blood, UA: NEGATIVE
GLUCOSE UA: NEGATIVE
Ketones, UA: NEGATIVE
Leukocytes, UA: NEGATIVE
Nitrite, UA: NEGATIVE
PROTEIN UA: NEGATIVE

## 2017-08-30 NOTE — Progress Notes (Signed)
[place] 23+1 wks,breech,posterior pl gr 0,cx 3.6 cm,normal ovaries bilat,fhr 164 bpm,svp of fluid 4 cm,anatomy of face complete,no obvious abnormalities

## 2017-08-30 NOTE — Progress Notes (Signed)
[id number] [redacted]w[redacted]d Estimated Date of Delivery: 12/26/17  Blood pressure 124/70, pulse 68, weight 296 lb (134.3 kg), last menstrual period 03/15/2017.   BP weight and urine results all reviewed and noted.  Please refer to the obstetrical flow sheet for the fundal height and fetal heart rate documentation:  Patient reports good fetal movement, denies any bleeding and no rupture of membranes symptoms or regular contractions. Patient is without complaints. All questions were answered.  Orders Placed This Encounter  Procedures  . POCT urinalysis dipstick    Plan:  Continued routine obstetrical care,   Return in about 4 weeks (around 09/27/2017) for PN2/LROB.

## 2017-08-30 NOTE — Patient Instructions (Signed)
1. Before your test, do not eat or drink anything for 8-10 hours prior to your  appointment (a small amount of water is allowed and you may take any medicines you normally take). Be sure to drink lots of water the day before. 2. When you arrive, your blood will be drawn for a 'fasting' blood sugar level.  Then you will be given a sweetened carbonated beverage to drink. You should  complete drinking this beverage within five minutes. After finishing the  beverage, you will have your blood drawn exactly 1 and 2 hours later. Having  your blood drawn on time is an important part of this test. A total of three blood  samples will be done. 3. The test takes approximately 2  hours. During the test, do not have anything to  eat or drink. Do not smoke, chew gum (not even sugarless gum) or use breath mints.  4. During the test you should remain close by and seated as much as possible and  avoid walking around. You may want to bring a book or something else to  occupy your time.  5. After your test, you may eat and drink as normal. You may want to bring a snack  to eat after the test is finished. Your provider will advise you as to the results of  this test and any follow-up if necessary  If your sugar test is positive for gestational diabetes, you will be given an phone call and further instructions discussed. If you wish to know all of your test results before your next appointment, feel free to call the office, or look up your test results on Mychart.  (The range that the lab uses for normal values of the sugar test are not necessarily the range that is used for pregnant women; if your results are within the normal range, they are definitely normal.  However, if a value is deemed "high" by the lab, it may not be too high for a pregnant woman.  We will need to discuss the results if your value(s) fall in the "high" category).     Tdap Vaccine  It is recommended that you get the Tdap vaccine during the  third trimester of EACH pregnancy to help protect your baby from getting pertussis (whooping cough)  27-36 weeks is the BEST time to do this so that you can pass the protection on to your baby. During pregnancy is better than after pregnancy, but if you are unable to get it during pregnancy it will be offered at the hospital.  You can get this vaccine at the health department or your family doctor, as well as some pharmacies.  Everyone who will be around your baby should also be up-to-date on their vaccines. Adults (who are not pregnant) only need 1 dose of Tdap during adulthood.

## 2017-09-03 ENCOUNTER — Other Ambulatory Visit: Payer: Self-pay

## 2017-09-03 ENCOUNTER — Encounter (HOSPITAL_COMMUNITY): Payer: Self-pay | Admitting: Emergency Medicine

## 2017-09-03 ENCOUNTER — Emergency Department (HOSPITAL_COMMUNITY)
Admission: EM | Admit: 2017-09-03 | Discharge: 2017-09-03 | Disposition: A | Discharge status: Home / Self Care | Payer: Medicaid Other | Attending: Emergency Medicine | Admitting: Emergency Medicine

## 2017-09-03 DIAGNOSIS — Z3A24 24 weeks gestation of pregnancy: Secondary | ICD-10-CM | POA: Insufficient documentation

## 2017-09-03 DIAGNOSIS — Y998 Other external cause status: Secondary | ICD-10-CM | POA: Diagnosis not present

## 2017-09-03 DIAGNOSIS — F1210 Cannabis abuse, uncomplicated: Secondary | ICD-10-CM | POA: Diagnosis not present

## 2017-09-03 DIAGNOSIS — Y9389 Activity, other specified: Secondary | ICD-10-CM | POA: Diagnosis not present

## 2017-09-03 DIAGNOSIS — Y92410 Unspecified street and highway as the place of occurrence of the external cause: Secondary | ICD-10-CM | POA: Insufficient documentation

## 2017-09-03 DIAGNOSIS — S29019A Strain of muscle and tendon of unspecified wall of thorax, initial encounter: Secondary | ICD-10-CM

## 2017-09-03 DIAGNOSIS — O9989 Other specified diseases and conditions complicating pregnancy, childbirth and the puerperium: Secondary | ICD-10-CM | POA: Insufficient documentation

## 2017-09-03 DIAGNOSIS — Z79899 Other long term (current) drug therapy: Secondary | ICD-10-CM | POA: Diagnosis not present

## 2017-09-03 DIAGNOSIS — Z87891 Personal history of nicotine dependence: Secondary | ICD-10-CM | POA: Insufficient documentation

## 2017-09-03 DIAGNOSIS — Z3490 Encounter for supervision of normal pregnancy, unspecified, unspecified trimester: Secondary | ICD-10-CM

## 2017-09-03 MED ORDER — CYCLOBENZAPRINE HCL 5 MG PO TABS: 5.0000 mg | ORAL_TABLET | Freq: Two times a day (BID) | ORAL | 0 refills | Status: DC | PRN
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
## 2017-09-03 NOTE — ED Notes (Signed)
Per Marita Kansas, RN at [facility] Dr. Glo Herring said it was okay for patient to come off monitor, tracing look good, EDP Pickering informed.

## 2017-09-03 NOTE — ED Notes (Signed)
ED Provider at bedside. 
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
## 2017-09-04 ENCOUNTER — Telehealth: Payer: Self-pay | Admitting: *Deleted
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
## 2017-09-27 ENCOUNTER — Other Ambulatory Visit: Payer: Medicaid Other

## 2017-09-27 ENCOUNTER — Ambulatory Visit (INDEPENDENT_AMBULATORY_CARE_PROVIDER_SITE_OTHER): Payer: Medicaid Other | Admitting: Women's Health

## 2017-09-27 ENCOUNTER — Encounter: Payer: Self-pay | Admitting: Women's Health

## 2017-09-27 VITALS — BP 140/60 | HR 92 | Wt 294.0 lb

## 2017-09-27 DIAGNOSIS — Z131 Encounter for screening for diabetes mellitus: Secondary | ICD-10-CM

## 2017-09-27 DIAGNOSIS — O10912 Unspecified pre-existing hypertension complicating pregnancy, second trimester: Secondary | ICD-10-CM | POA: Diagnosis not present

## 2017-09-27 DIAGNOSIS — Z1389 Encounter for screening for other disorder: Secondary | ICD-10-CM

## 2017-09-27 DIAGNOSIS — Z3A27 27 weeks gestation of pregnancy: Secondary | ICD-10-CM

## 2017-09-27 DIAGNOSIS — Z331 Pregnant state, incidental: Secondary | ICD-10-CM | POA: Diagnosis not present

## 2017-09-27 DIAGNOSIS — Z3483 Encounter for supervision of other normal pregnancy, third trimester: Secondary | ICD-10-CM

## 2017-09-27 DIAGNOSIS — O0990 Supervision of high risk pregnancy, unspecified, unspecified trimester: Secondary | ICD-10-CM

## 2017-09-27 DIAGNOSIS — O0992 Supervision of high risk pregnancy, unspecified, second trimester: Secondary | ICD-10-CM

## 2017-09-27 DIAGNOSIS — O10919 Unspecified pre-existing hypertension complicating pregnancy, unspecified trimester: Secondary | ICD-10-CM

## 2017-09-27 LAB — POCT URINALYSIS DIPSTICK
GLUCOSE UA: NEGATIVE
KETONES UA: NEGATIVE
Leukocytes, UA: NEGATIVE
Nitrite, UA: NEGATIVE
Protein, UA: NEGATIVE
RBC UA: NEGATIVE

## 2017-09-27 NOTE — Progress Notes (Signed)
   [name] name: Tonya Bell MRN [id number]  Date of birth: 01-Oct-1986 Chief Complaint:   Routine Prenatal Visit (PN2)  History of Present Illness:   Tonya Bell is a 31 y.o. [phone] female at [redacted]w[redacted]d with an Estimated Date of Delivery: 12/26/17 being seen today for ongoing management of a high-risk pregnancy complicated by chronic HTN. No meds Today she reports no complaints. Contractions: Not present.  .  Movement: Present. denies leaking of fluid.  Review of Systems:   Pertinent items are noted in HPI Denies abnormal vaginal discharge w/ itching/odor/irritation, headaches, visual changes, shortness of breath, chest pain, abdominal pain, severe nausea/vomiting, or problems with urination or bowel movements unless otherwise stated above. Pertinent History Reviewed:  Reviewed past medical,surgical, social, obstetrical and family history.  Reviewed problem list, medications and allergies. Physical Assessment:   Vitals:   09/27/17 0856  BP: 140/60  Pulse: 92  Weight: 294 lb (133.4 kg)  Body mass index is 48.92 kg/m.           Physical Examination:   General appearance: alert, well appearing, and in no distress  Mental status: alert, oriented to person, place, and time  Skin: warm & dry   Extremities: Edema: None    Cardiovascular: normal heart rate noted  Respiratory: normal respiratory effort, no distress  Abdomen: gravid, soft, non-tender  Pelvic: Cervical exam deferred         Fetal Status: Fetal Heart Rate (bpm): 157 Fundal Height: 30 cm Movement: Present    Fetal Surveillance Testing today: FHT doppler   Results for orders placed or performed in visit on 09/27/17 (from the past 24 hour(s))  POCT urinalysis dipstick   Collection Time: 09/27/17  9:03 AM  Result Value Ref Range   Color, UA     Clarity, UA     Glucose, UA neg    Bilirubin, UA     Ketones, UA neg    Spec Grav, UA  1.010 - 1.025   Blood, UA neg    pH, UA  5.0 - 8.0   Protein, UA neg    Urobilinogen, UA  0.2 or 1.0 E.U./dL   Nitrite, UA neg    Leukocytes, UA Negative Negative   Appearance     Odor      Assessment & Plan:  1) High-risk pregnancy [id number] at [redacted]w[redacted]d with an Estimated Date of Delivery: 12/26/17   2) CHTN- no meds, stable, continue baby asa. Will get baseline cmp and p:c ratio today  Labs/procedures today: pn2  Treatment Plan:   Growth u/s @ 28, 34, 38wks     2x/wk testing nst/sono @ 32wks or weekly BPP   Deliver @ 40wks (no meds), 39wks (meds)  Reviewed: Preterm labor symptoms and general obstetric precautions including but not limited to vaginal bleeding, contractions, leaking of fluid and fetal movement were reviewed in detail with the patient. Recommended Tdap at HD/PCP per CDC guidelines.  All questions were answered.  Follow-up: Return for next week for efw/afi u/s (no visit), then 3wks for HROB.  Orders Placed This Encounter  Procedures  . Comprehensive metabolic panel  . Protein / creatinine ratio, urine  . POCT urinalysis dipstick   [name] CNM, [facility] 09/27/2017 9:29 AM

## 2017-09-27 NOTE — Patient Instructions (Signed)
[facility], I greatly value your feedback.  If you receive a survey following your visit with [place] today, we appreciate you taking the time to fill it out.  Thanks, Knute Neu, CNM, WHNP-BC   Call the office [phone]) or go to [facility] if:  You begin to have strong, frequent contractions  Your water breaks.  Sometimes it is a big gush of fluid, sometimes it is just a trickle that keeps getting your panties wet or running down your legs  You have vaginal bleeding.  It is normal to have a small amount of spotting if your cervix was checked.   You don't feel your baby moving like normal.  If you don't, get you something to eat and drink and lay down and focus on feeling your baby move.  You should feel at least 10 movements in 2 hours.  If you don't, you should call the office or go to [facility].    Tdap Vaccine  It is recommended that you get the Tdap vaccine during the third trimester of EACH pregnancy to help protect your baby from getting pertussis (whooping cough)  27-36 weeks is the BEST time to do this so that you can pass the protection on to your baby. During pregnancy is better than after pregnancy, but if you are unable to get it during pregnancy it will be offered at the hospital.   You can get this vaccine at the health department or your family doctor  Everyone who will be around your baby should also be up-to-date on their vaccines. Adults (who are not pregnant) only need 1 dose of Tdap during adulthood.   Third Trimester of Pregnancy The third trimester is from week 29 through week 42, months 7 through 9. The third trimester is a time when the fetus is growing rapidly. At the end of the ninth month, the fetus is about 20 inches in length and weighs 6-10 pounds.  BODY CHANGES Your body goes through many changes during pregnancy. The changes vary from woman to woman.   Your weight will continue to increase. You can expect to gain 25-35 pounds (11-16 kg)  by the end of the pregnancy.  You may begin to get stretch marks on your hips, abdomen, and breasts.  You may urinate more often because the fetus is moving lower into your pelvis and pressing on your bladder.  You may develop or continue to have heartburn as a result of your pregnancy.  You may develop constipation because certain hormones are causing the muscles that push waste through your intestines to slow down.  You may develop hemorrhoids or swollen, bulging veins (varicose veins).  You may have pelvic pain because of the weight gain and pregnancy hormones relaxing your joints between the bones in your pelvis. Backaches may result from overexertion of the muscles supporting your posture.  You may have changes in your hair. These can include thickening of your hair, rapid growth, and changes in texture. Some women also have hair loss during or after pregnancy, or hair that feels dry or thin. Your hair will most likely return to normal after your baby is born.  Your breasts will continue to grow and be tender. A yellow discharge may leak from your breasts called colostrum.  Your belly button may stick out.  You may feel short of breath because of your expanding uterus.  You may notice the fetus "dropping," or moving lower in your abdomen.  You may have a bloody  mucus discharge. This usually occurs a few days to a week before labor begins.  Your cervix becomes thin and soft (effaced) near your due date. WHAT TO EXPECT AT YOUR PRENATAL EXAMS  You will have prenatal exams every 2 weeks until week 36. Then, you will have weekly prenatal exams. During a routine prenatal visit:  You will be weighed to make sure you and the fetus are growing normally.  Your blood pressure is taken.  Your abdomen will be measured to track your baby's growth.  The fetal heartbeat will be listened to.  Any test results from the previous visit will be discussed.  You may have a cervical check near  your due date to see if you have effaced. At around 36 weeks, your caregiver will check your cervix. At the same time, your caregiver will also perform a test on the secretions of the vaginal tissue. This test is to determine if a type of bacteria, Group B streptococcus, is present. Your caregiver will explain this further. Your caregiver may ask you:  What your birth plan is.  How you are feeling.  If you are feeling the baby move.  If you have had any abnormal symptoms, such as leaking fluid, bleeding, severe headaches, or abdominal cramping.  If you have any questions. Other tests or screenings that may be performed during your third trimester include:  Blood tests that check for low iron levels (anemia).  Fetal testing to check the health, activity level, and growth of the fetus. Testing is done if you have certain medical conditions or if there are problems during the pregnancy. FALSE LABOR You may feel small, irregular contractions that eventually go away. These are called Braxton Hicks contractions, or false labor. Contractions may last for hours, days, or even weeks before true labor sets in. If contractions come at regular intervals, intensify, or become painful, it is best to be seen by your caregiver.  SIGNS OF LABOR   Menstrual-like cramps.  Contractions that are 5 minutes apart or less.  Contractions that start on the top of the uterus and spread down to the lower abdomen and back.  A sense of increased pelvic pressure or back pain.  A watery or bloody mucus discharge that comes from the vagina. If you have any of these signs before the 37th week of pregnancy, call your caregiver right away. You need to go to the hospital to get checked immediately. HOME CARE INSTRUCTIONS   Avoid all smoking, herbs, alcohol, and unprescribed drugs. These chemicals affect the formation and growth of the baby.  Follow your caregiver's instructions regarding medicine use. There are  medicines that are either safe or unsafe to take during pregnancy.  Exercise only as directed by your caregiver. Experiencing uterine cramps is a good sign to stop exercising.  Continue to eat regular, healthy meals.  Wear a good support bra for breast tenderness.  Do not use hot tubs, steam rooms, or saunas.  Wear your seat belt at all times when driving.  Avoid raw meat, uncooked cheese, cat litter boxes, and soil used by cats. These carry germs that can cause birth defects in the baby.  Take your prenatal vitamins.  Try taking a stool softener (if your caregiver approves) if you develop constipation. Eat more high-fiber foods, such as fresh vegetables or fruit and whole grains. Drink plenty of fluids to keep your urine clear or pale yellow.  Take warm sitz baths to soothe any pain or discomfort caused by hemorrhoids.  Use hemorrhoid cream if your caregiver approves.  If you develop varicose veins, wear support hose. Elevate your feet for 15 minutes, 3-4 times a day. Limit salt in your diet.  Avoid heavy lifting, wear low heal shoes, and practice good posture.  Rest a lot with your legs elevated if you have leg cramps or low back pain.  Visit your dentist if you have not gone during your pregnancy. Use a soft toothbrush to brush your teeth and be gentle when you floss.  A sexual relationship may be continued unless your caregiver directs you otherwise.  Do not travel far distances unless it is absolutely necessary and only with the approval of your caregiver.  Take prenatal classes to understand, practice, and ask questions about the labor and delivery.  Make a trial run to the hospital.  Pack your hospital bag.  Prepare the baby's nursery.  Continue to go to all your prenatal visits as directed by your caregiver. SEEK MEDICAL CARE IF:  You are unsure if you are in labor or if your water has broken.  You have dizziness.  You have mild pelvic cramps, pelvic pressure, or  nagging pain in your abdominal area.  You have persistent nausea, vomiting, or diarrhea.  You have a bad smelling vaginal discharge.  You have pain with urination. SEEK IMMEDIATE MEDICAL CARE IF:   You have a fever.  You are leaking fluid from your vagina.  You have spotting or bleeding from your vagina.  You have severe abdominal cramping or pain.  You have rapid weight loss or gain.  You have shortness of breath with chest pain.  You notice sudden or extreme swelling of your face, hands, ankles, feet, or legs.  You have not felt your baby move in over an hour.  You have severe headaches that do not go away with medicine.  You have vision changes. Document Released: 09/27/2001 Document Revised: 10/08/2013 Document Reviewed: 12/04/2012 [facility] Patient Information 2015 [place], [place]. This information is not intended to replace advice given to you by your health care provider. Make sure you discuss any questions you have with your health care provider.

## 2017-09-28 LAB — GLUCOSE TOLERANCE, 2 HOURS W/ 1HR
GLUCOSE, 1 HOUR: 129 mg/dL (ref 65–179)
GLUCOSE, 2 HOUR: 84 mg/dL (ref 65–152)
Glucose, Fasting: 99 mg/dL — ABNORMAL HIGH (ref 65–91)

## 2017-09-28 LAB — COMPREHENSIVE METABOLIC PANEL
ALK PHOS: 37 IU/L — AB (ref 39–117)
ALT: 11 IU/L (ref 0–32)
AST: 12 IU/L (ref 0–40)
Albumin/Globulin Ratio: 1.3 (ref 1.2–2.2)
Albumin: 3.6 g/dL (ref 3.5–5.5)
BUN/Creatinine Ratio: 9 (ref 9–23)
BUN: 5 mg/dL — ABNORMAL LOW (ref 6–20)
Bilirubin Total: 0.2 mg/dL (ref 0.0–1.2)
CALCIUM: 8.9 mg/dL (ref 8.7–10.2)
CO2: 20 mmol/L (ref 20–29)
CREATININE: 0.55 mg/dL — AB (ref 0.57–1.00)
Chloride: 105 mmol/L (ref 96–106)
GFR calc Af Amer: 144 mL/min/{1.73_m2} (ref >59)
GFR, EST NON AFRICAN AMERICAN: 125 mL/min/{1.73_m2} (ref >59)
GLOBULIN, TOTAL: 2.7 g/dL (ref 1.5–4.5)
Glucose: 130 mg/dL — ABNORMAL HIGH (ref 65–99)
POTASSIUM: 4.1 mmol/L (ref 3.5–5.2)
SODIUM: 138 mmol/L (ref 134–144)
Total Protein: 6.3 g/dL (ref 6.0–8.5)

## 2017-09-28 LAB — CBC
HEMATOCRIT: 31.6 % — AB (ref 34.0–46.6)
HEMOGLOBIN: 10.5 g/dL — AB (ref 11.1–15.9)
MCH: 28.5 pg (ref 26.6–33.0)
MCHC: 33.2 g/dL (ref 31.5–35.7)
MCV: 86 fL (ref 79–97)
Platelets: 617 10*3/uL — ABNORMAL HIGH (ref 150–379)
RBC: 3.69 x10E6/uL — AB (ref 3.77–5.28)
RDW: 14.3 % (ref 12.3–15.4)
WBC: 12.2 10*3/uL — ABNORMAL HIGH (ref 3.4–10.8)

## 2017-09-28 LAB — RPR: RPR: NONREACTIVE

## 2017-09-28 LAB — PROTEIN / CREATININE RATIO, URINE
CREATININE, UR: 174.3 mg/dL
PROTEIN UR: 19.4 mg/dL
PROTEIN/CREAT RATIO: 111 mg/g{creat} (ref 0–200)

## 2017-09-28 LAB — HIV ANTIBODY (ROUTINE TESTING W REFLEX): HIV Screen 4th Generation wRfx: NONREACTIVE

## 2017-09-28 LAB — ANTIBODY SCREEN: Antibody Screen: NEGATIVE

## 2017-09-29 ENCOUNTER — Telehealth: Payer: Self-pay | Admitting: *Deleted

## 2017-09-29 ENCOUNTER — Encounter: Payer: Self-pay | Admitting: Women's Health

## 2017-09-29 ENCOUNTER — Other Ambulatory Visit: Payer: Self-pay | Admitting: Women's Health

## 2017-09-29 DIAGNOSIS — O24419 Gestational diabetes mellitus in pregnancy, unspecified control: Secondary | ICD-10-CM | POA: Insufficient documentation

## 2017-09-29 DIAGNOSIS — O24410 Gestational diabetes mellitus in pregnancy, diet controlled: Secondary | ICD-10-CM

## 2017-09-29 NOTE — Telephone Encounter (Signed)
Informed patient she has gestational diabetes. Referral was sent to dietician, she should get a call from them within the next week to schedule appointment, if not to let us know.  She will be checking blood sugars 4 times a day, bring log to each appointment. All questions answered. Verbalized understanding.

## 2017-10-02 ENCOUNTER — Encounter: Payer: Self-pay | Admitting: Skilled Nursing Facility1

## 2017-10-02 ENCOUNTER — Encounter: Payer: Medicaid Other | Attending: Family Medicine | Admitting: Skilled Nursing Facility1

## 2017-10-02 DIAGNOSIS — O24410 Gestational diabetes mellitus in pregnancy, diet controlled: Secondary | ICD-10-CM | POA: Insufficient documentation

## 2017-10-02 NOTE — Progress Notes (Signed)
Diabetes Self-Management Education  Visit Type: First/Initial  10/02/2017  [name], identified by name and date of birth, is a 31 y.o. female with a diagnosis of Diabetes: Gestational Diabetes.   ASSESSMENT  Height 5\' 5"  (1.651 m), weight 294 lb (133.4 kg), last menstrual period 03/15/2017. Body mass index is 48.92 kg/m. Pt states this is her 4th pregnancy and first with GDM. Pt states she lives with her grandmother who has diabetes. Pt states she was drinking large amounts of Gatorade until she realized her blood pressure was increasing.  Pt given accucchek guide  Lot: [id number]  Exp: 12/10/2018  Diabetes Self-Management Education - 10/02/17 0903      Visit Information   Visit Type  First/Initial      Initial Visit   Diabetes Type  Gestational Diabetes    Are you currently following a meal plan?  No    Are you taking your medications as prescribed?  Not on Medications      Health Coping   How would you rate your overall health?  Good      Psychosocial Assessment   Patient Belief/Attitude about Diabetes  Motivated to manage diabetes    Self-management support  Friends;Family      Pre-Education Assessment   Patient understands the diabetes disease and treatment process.  Needs Instruction    Patient understands incorporating nutritional management into lifestyle.  Needs Instruction    Patient undertands incorporating physical activity into lifestyle.  Needs Instruction    Patient understands using medications safely.  Needs Instruction    Patient understands monitoring blood glucose, interpreting and using results  Needs Instruction    Patient understands prevention, detection, and treatment of acute complications.  Needs Instruction    Patient understands prevention, detection, and treatment of chronic complications.  Needs Instruction    Patient understands how to develop strategies to address psychosocial issues.  Needs Instruction    Patient understands how to  develop strategies to promote health/change behavior.  Needs Instruction      Complications   How often do you check your blood sugar?  0 times/day (not testing)    Have you had a dilated eye exam in the past 12 months?  No    Have you had a dental exam in the past 12 months?  No    Are you checking your feet?  No      Dietary Intake   Breakfast  1 egg and 3 bacon    Snack (morning)  fruit    Lunch  greens, green beans, [place], fried chicken and potato salad    Snack (afternoon)  fruit    Dinner  6 chicken wings     Beverage(s)  grape juice, water, V8 fruit juice       Exercise   Exercise Type  Light (walking / raking leaves)    How many days per week to you exercise?  5    How many minutes per day do you exercise?  15    Total minutes per week of exercise  75      Patient Education   Previous Diabetes Education  No    Disease state   Factors that contribute to the development of diabetes;Explored patient's options for treatment of their diabetes    Nutrition management   Role of diet in the treatment of diabetes and the relationship between the three main macronutrients and blood glucose level;Carbohydrate counting;Meal timing in regards to the patients' current diabetes medication.;Food label reading,  portion sizes and measuring food.;Reviewed blood glucose goals for pre and post meals and how to evaluate the patients' food intake on their blood glucose level.;Meal options for control of blood glucose level and chronic complications.    Physical activity and exercise   Role of exercise on diabetes management, blood pressure control and cardiac health.    Monitoring  Taught/evaluated SMBG meter.;Purpose and frequency of SMBG.;Yearly dilated eye exam    Acute complications  Taught treatment of hypoglycemia - the 15 rule.    Chronic complications  Relationship between chronic complications and blood glucose control    Psychosocial adjustment  Role of stress on diabetes    Preconception  care  Reviewed with patient blood glucose goals with pregnancy      Individualized Goals (developed by patient)   Physical Activity  Exercise 3-5 times per week;30 minutes per day    Monitoring   test my blood glucose as discussed;test blood glucose pre and post meals as discussed      Post-Education Assessment   Patient understands the diabetes disease and treatment process.  Demonstrates understanding / competency    Patient understands incorporating nutritional management into lifestyle.  Demonstrates understanding / competency    Patient undertands incorporating physical activity into lifestyle.  Demonstrates understanding / competency    Patient understands using medications safely.  Demonstrates understanding / competency    Patient understands monitoring blood glucose, interpreting and using results  Demonstrates understanding / competency    Patient understands prevention, detection, and treatment of acute complications.  Demonstrates understanding / competency    Patient understands prevention, detection, and treatment of chronic complications.  Demonstrates understanding / competency    Patient understands how to develop strategies to address psychosocial issues.  Demonstrates understanding / competency    Patient understands how to develop strategies to promote health/change behavior.  Demonstrates understanding / competency      Outcomes   Expected Outcomes  Demonstrated interest in learning. Expect positive outcomes    Future DMSE  PRN    Program Status  Completed       Individualized Plan for Diabetes Self-Management Training:   Learning Objective:  Patient will have a greater understanding of diabetes self-management. Patient education plan is to attend individual and/or group sessions per assessed needs and concerns.   Plan:   There are no Patient Instructions on file for this visit.  Expected Outcomes:  Demonstrated interest in learning. Expect positive  outcomes  Education material provided: Meal plan card and My Plate  If problems or questions, patient to contact team via:  Phone  Future DSME appointment: PRN [phone]

## 2017-10-04 ENCOUNTER — Ambulatory Visit (INDEPENDENT_AMBULATORY_CARE_PROVIDER_SITE_OTHER): Payer: Medicaid Other

## 2017-10-04 ENCOUNTER — Telehealth: Payer: Self-pay | Admitting: *Deleted

## 2017-10-04 DIAGNOSIS — O0992 Supervision of high risk pregnancy, unspecified, second trimester: Secondary | ICD-10-CM

## 2017-10-04 DIAGNOSIS — Z3A28 28 weeks gestation of pregnancy: Secondary | ICD-10-CM | POA: Diagnosis not present

## 2017-10-04 DIAGNOSIS — O10919 Unspecified pre-existing hypertension complicating pregnancy, unspecified trimester: Secondary | ICD-10-CM

## 2017-10-04 DIAGNOSIS — A749 Chlamydial infection, unspecified: Secondary | ICD-10-CM

## 2017-10-04 DIAGNOSIS — D473 Essential (hemorrhagic) thrombocythemia: Secondary | ICD-10-CM

## 2017-10-04 DIAGNOSIS — D75839 Thrombocytosis, unspecified: Secondary | ICD-10-CM

## 2017-10-04 DIAGNOSIS — O0990 Supervision of high risk pregnancy, unspecified, unspecified trimester: Secondary | ICD-10-CM

## 2017-10-04 NOTE — Progress Notes (Signed)
[place] 14+6 wks,cephalic,cx 3.8 cm,posterior pl gr 0,normal ovaries bilat,afi 10 cm,fhr 142 bpm,EFW 1200 g 53%

## 2017-10-04 NOTE — Telephone Encounter (Signed)
Lancets and strips called for accu-check guide called to pharmacy. Pt to check blood sugar 4 times daily with PRN refills.

## 2017-10-17 NOTE — L&D Delivery Note (Addendum)
Delivery Note  IOL for CHTN, A2DM:  Cytotec/FB/Pit/ AROM  After a one push 2nd stage, at 8:14 PM a viable female was delivered via Vaginal, Spontaneous (Presentation: LOA  ).  APGAR: 8, 9; weight pending. Loose nuchal, reduced. After 1 minute, the cord was clamped and cut. 40 units of pitocin diluted in 1000cc LR was infused rapidly IV.  The placenta separated spontaneously and delivered via CCT and maternal pushing effort.  It was inspected and appears to be intact with a 3 VC Anesthesia:  epidural Episiotomy: None Lacerations: 1st degree;Perineal Suture Repair: 2.0 vicryl Est. Blood Loss (mL): 70  Mom to postpartum.  Baby to Couplet care / Skin to Skin.  Tonya Bell 12/21/2017, 8:33 PM  .    Please schedule this patient for PP visit in: 6 weeks High risk pregnancy complicated by: CHTN (started on meds at 30 weeks, no meds prior to pregnancy) and GDM Delivery mode:  SVD Anticipated Birth Control:  Condoms PP Procedures needed: BP check at one week and a gtt when she comes for her postpartum (do pp in 6 weeks instead of 4) Schedule Integrated BH visit: no Provider: Lab and any provider

## 2017-10-18 ENCOUNTER — Encounter: Payer: Self-pay | Admitting: Obstetrics and Gynecology

## 2017-10-18 ENCOUNTER — Ambulatory Visit (INDEPENDENT_AMBULATORY_CARE_PROVIDER_SITE_OTHER): Payer: Medicaid Other | Admitting: Obstetrics and Gynecology

## 2017-10-18 VITALS — BP 140/62 | HR 98 | Wt 293.6 lb

## 2017-10-18 DIAGNOSIS — Z331 Pregnant state, incidental: Secondary | ICD-10-CM

## 2017-10-18 DIAGNOSIS — Z3A30 30 weeks gestation of pregnancy: Secondary | ICD-10-CM | POA: Diagnosis not present

## 2017-10-18 DIAGNOSIS — Z1389 Encounter for screening for other disorder: Secondary | ICD-10-CM | POA: Diagnosis not present

## 2017-10-18 DIAGNOSIS — O0990 Supervision of high risk pregnancy, unspecified, unspecified trimester: Secondary | ICD-10-CM

## 2017-10-18 DIAGNOSIS — O10913 Unspecified pre-existing hypertension complicating pregnancy, third trimester: Secondary | ICD-10-CM

## 2017-10-18 DIAGNOSIS — O24419 Gestational diabetes mellitus in pregnancy, unspecified control: Secondary | ICD-10-CM | POA: Diagnosis not present

## 2017-10-18 LAB — POCT URINALYSIS DIPSTICK
Glucose, UA: NEGATIVE
Ketones, UA: NEGATIVE
NITRITE UA: NEGATIVE
PROTEIN UA: NEGATIVE

## 2017-10-18 MED ORDER — LABETALOL HCL 200 MG PO TABS: 200.0000 mg | ORAL_TABLET | Freq: Two times a day (BID) | ORAL | 3 refills | Status: DC

## 2017-10-18 MED ORDER — METFORMIN HCL 500 MG PO TABS: 500.0000 mg | ORAL_TABLET | Freq: Every day | ORAL | 2 refills | Status: DC

## 2017-10-18 NOTE — Progress Notes (Signed)
Patient ID: Tonya Bell, female   DOB: 11-30-1985, 32 y.o.   MRN: [id number]   [facility] PREGNANCY VISIT Patient name: Tonya Bell MRN [id number]  Date of birth: 23-Nov-1985 Chief Complaint:   High Risk Gestation  History of Present Illness:   Tonya Bell is a 32 y.o. [phone] female at [redacted]w[redacted]d with an Estimated Date of Delivery: 12/26/17 being seen today for ongoing management of a high-risk pregnancy complicated by chronic HTN and gestational diabetes. Her fasting numbers have been above 95. Her sugars are milady elevated in the morning.  Today she reports no complaints. Contractions: Not present. Vag. Bleeding: None.  Movement: Present. denies leaking of fluid.  Review of Systems:   Pertinent items are noted in HPI Denies abnormal vaginal discharge w/ itching/odor/irritation, headaches, visual changes, shortness of breath, chest pain, abdominal pain, severe nausea/vomiting, or problems with urination or bowel movements unless otherwise stated above. Pertinent History Reviewed:  Reviewed past medical,surgical, social, obstetrical and family history.  Reviewed problem list, medications and allergies. Physical Assessment:   Vitals:   10/18/17 1149  BP: 140/62  Pulse: 98  Weight: 293 lb 9.6 oz (133.2 kg)  Body mass index is 48.86 kg/m.           Physical Examination:   General appearance: alert, well appearing, and in no distress, oriented to person, place, and time and overweight  Mental status: alert, oriented to person, place, and time, normal mood, behavior, speech, dress, motor activity, and thought processes, affect appropriate to mood  Skin: warm & dry   Extremities: Edema: None    Cardiovascular: normal heart rate noted  Respiratory: normal respiratory effort, no distress  Abdomen: gravid, soft, non-tender  Pelvic: Cervical exam deferred         Fetal Status: Fetal Heart Rate (bpm): 141 Fundal Height: 32 cm Movement: Present     Results for orders placed or  performed in visit on 10/18/17 (from the past 24 hour(s))  POCT urinalysis dipstick   Collection Time: 10/18/17 11:51 AM  Result Value Ref Range   Color, UA     Clarity, UA     Glucose, UA neg    Bilirubin, UA     Ketones, UA neg    Spec Grav, UA  1.010 - 1.025   Blood, UA trace    pH, UA  5.0 - 8.0   Protein, UA neg    Urobilinogen, UA  0.2 or 1.0 E.U./dL   Nitrite, UA neg    Leukocytes, UA Trace (A) Negative   Appearance     Odor      Assessment & Plan:  1) High-risk pregnancy [id number] at [redacted]w[redacted]d with an Estimated Date of Delivery: 12/26/17   2) Chronic HTN,  Persistent 824 systolics, will begin low dose labetalol, as pt likely to begin increasing in 3rd trimester.  3) Gestational Diabetes, persistent elevated fastings begin metformin 500 q pm.  Labs/procedures today:  u/s  Treatment Plan:  1. U/S alternating BPP, weekly testing 2. Start medication for HTN, labetalol 100 mg tablet twice daily,  3. Metformin 500 mg at night q pm  Reviewed: Preterm labor symptoms and general obstetric precautions including but not limited to vaginal bleeding, contractions, leaking of fluid and fetal movement were reviewed in detail with the patient.  All questions were answered.  Follow-up: Return in about 1 week (around 10/25/2017) for LROB, U/S: BPP, U/S: ANATOMY.  Orders Placed This Encounter  Procedures   POCT urinalysis dipstick   By signing  my name below, I, Margit Banda, attest that this documentation has been prepared under the direction and in the presence of Jonnie Kind, MD. Electronically Signed: Margit Banda, Medical Scribe. 10/18/17. 12:42 PM.  I personally performed the services described in this documentation, which was SCRIBED in my presence. The recorded information has been reviewed and considered accurate. It has been edited as necessary during review. Jonnie Kind, MD

## 2017-10-24 ENCOUNTER — Other Ambulatory Visit: Payer: Self-pay | Admitting: Obstetrics and Gynecology

## 2017-10-24 DIAGNOSIS — O10919 Unspecified pre-existing hypertension complicating pregnancy, unspecified trimester: Secondary | ICD-10-CM

## 2017-10-24 DIAGNOSIS — I10 Essential (primary) hypertension: Secondary | ICD-10-CM

## 2017-10-25 ENCOUNTER — Ambulatory Visit (INDEPENDENT_AMBULATORY_CARE_PROVIDER_SITE_OTHER): Payer: Medicaid Other | Admitting: Obstetrics and Gynecology

## 2017-10-25 ENCOUNTER — Ambulatory Visit (INDEPENDENT_AMBULATORY_CARE_PROVIDER_SITE_OTHER): Payer: Medicaid Other

## 2017-10-25 VITALS — BP 128/70 | HR 88 | Wt 291.8 lb

## 2017-10-25 DIAGNOSIS — Z1389 Encounter for screening for other disorder: Secondary | ICD-10-CM | POA: Diagnosis not present

## 2017-10-25 DIAGNOSIS — Z3A31 31 weeks gestation of pregnancy: Secondary | ICD-10-CM | POA: Diagnosis not present

## 2017-10-25 DIAGNOSIS — F17210 Nicotine dependence, cigarettes, uncomplicated: Secondary | ICD-10-CM

## 2017-10-25 DIAGNOSIS — O99333 Smoking (tobacco) complicating pregnancy, third trimester: Secondary | ICD-10-CM

## 2017-10-25 DIAGNOSIS — O10913 Unspecified pre-existing hypertension complicating pregnancy, third trimester: Secondary | ICD-10-CM | POA: Diagnosis not present

## 2017-10-25 DIAGNOSIS — O24415 Gestational diabetes mellitus in pregnancy, controlled by oral hypoglycemic drugs: Secondary | ICD-10-CM | POA: Diagnosis not present

## 2017-10-25 DIAGNOSIS — A749 Chlamydial infection, unspecified: Secondary | ICD-10-CM

## 2017-10-25 DIAGNOSIS — O24410 Gestational diabetes mellitus in pregnancy, diet controlled: Secondary | ICD-10-CM

## 2017-10-25 DIAGNOSIS — D473 Essential (hemorrhagic) thrombocythemia: Secondary | ICD-10-CM

## 2017-10-25 DIAGNOSIS — Z331 Pregnant state, incidental: Secondary | ICD-10-CM | POA: Diagnosis not present

## 2017-10-25 DIAGNOSIS — O99210 Obesity complicating pregnancy, unspecified trimester: Secondary | ICD-10-CM

## 2017-10-25 DIAGNOSIS — D75839 Thrombocytosis, unspecified: Secondary | ICD-10-CM

## 2017-10-25 DIAGNOSIS — O10919 Unspecified pre-existing hypertension complicating pregnancy, unspecified trimester: Secondary | ICD-10-CM

## 2017-10-25 DIAGNOSIS — O0990 Supervision of high risk pregnancy, unspecified, unspecified trimester: Secondary | ICD-10-CM

## 2017-10-25 DIAGNOSIS — O133 Gestational [pregnancy-induced] hypertension without significant proteinuria, third trimester: Secondary | ICD-10-CM

## 2017-10-25 DIAGNOSIS — O0993 Supervision of high risk pregnancy, unspecified, third trimester: Secondary | ICD-10-CM

## 2017-10-25 LAB — POCT URINALYSIS DIPSTICK
Glucose, UA: NEGATIVE
KETONES UA: NEGATIVE
Leukocytes, UA: NEGATIVE
NITRITE UA: NEGATIVE
PROTEIN UA: NEGATIVE
RBC UA: NEGATIVE

## 2017-10-25 NOTE — Progress Notes (Signed)
[place] 37+3 wks,cephalic,BPP 6/6,KDPTEL ovaries bilat,fhr 152 bpm,posterior pl gr 1,afi 12 cm,RI .74,.59,.67=72% S/D 3.02 =65%

## 2017-10-25 NOTE — Patient Instructions (Signed)

## 2017-10-25 NOTE — Progress Notes (Signed)
Patient ID: LEIAH GIANNOTTI, female   DOB: 05/13/1986, 32 y.o.   MRN: [id number]   [facility] PREGNANCY VISIT Patient name: Tonya Bell MRN [id number]  Date of birth: 02/05/1986 Chief Complaint:   Routine Prenatal Visit (BPP )  History of Present Illness:   Tonya Bell is a 32 y.o. [phone] female at [redacted]w[redacted]d with an Estimated Date of Delivery: 12/26/17 being seen today for ongoing management of a high-risk pregnancy complicated by chronic HTN, gestational A2 DM.  Today she reports no complaints. The patient is planning to breastfeed. She has been monitoring her blood sugars and notes not eating after 8 pm. At resting her  Contractions: Not present. Vag. Bleeding: None.  Movement: Present. denies leaking of fluid.  Review of Systems:   Pertinent items are noted in HPI Denies abnormal vaginal discharge w/ itching/odor/irritation, headaches, visual changes, shortness of breath, chest pain, abdominal pain, severe nausea/vomiting, or problems with urination or bowel movements unless otherwise stated above. Pertinent History Reviewed:  Reviewed past medical,surgical, social, obstetrical and family history.  Reviewed problem list, medications and allergies. Physical Assessment:   Vitals:   10/25/17 0914  BP: 128/70  Pulse: 88  Weight: 291 lb 12.8 oz (132.4 kg)  Body mass index is 48.56 kg/m.           Physical Examination:   General appearance: alert, well appearing, and in no distress and oriented to person, place, and time  Mental status: alert, oriented to person, place, and time, normal mood, behavior, speech, dress, motor activity, and thought processes, affect appropriate to mood  Skin: warm & dry   Extremities: Edema: None    Cardiovascular: normal heart rate noted  Respiratory: normal respiratory effort, no distress  Abdomen: gravid, soft, non-tender  Pelvic: Cervical exam deferred         Fetal Status: Fetal Heart Rate (bpm): 152   Movement: Present    Fetal  Surveillance Testing today: BPP   Results for orders placed or performed in visit on 10/25/17 (from the past 24 hour(s))  POCT Urinalysis Dipstick   Collection Time: 10/25/17  9:52 AM  Result Value Ref Range   Color, UA     Clarity, UA     Glucose, UA neg    Bilirubin, UA     Ketones, UA neg    Spec Grav, UA  1.010 - 1.025   Blood, UA neg    pH, UA  5.0 - 8.0   Protein, UA neg    Urobilinogen, UA  0.2 or 1.0 E.U./dL   Nitrite, UA neg    Leukocytes, UA Negative Negative   Appearance     Odor      Assessment & Plan:  1) High-risk pregnancy G4P1021 at [redacted]w[redacted]d with an Estimated Date of Delivery: 12/26/17   2) Chronic HTN, stable, labetalol 100 mg twice daily  3) Gestational A2 DM, elevated at night, after another week if still high will increase metformin 500 mg   Meds: No orders of the defined types were placed in this encounter.   Labs/procedures today: BPP  Treatment Plan:   1. U/S alternating BPP, weekly testing  2. growth ultrasound every 3 weeks 3. Labetalol 100 mg tablet twice daily Metformin 500 mg at night q pm, if resting sugars continue to be elevated metformin may be increased at bedtime  .  Follow-up: Return in about 1 week (around 11/01/2017) for [place]. With BPP Orders Placed This Encounter  Procedures  . POCT Urinalysis Dipstick  By signing my name below, I, Margit Banda, attest that this documentation has been prepared under the direction and in the presence of Jonnie Kind, MD. Electronically Signed: Margit Banda, Medical Scribe. 10/25/17. 10:05 AM.  I personally performed the services described in this documentation, which was SCRIBED in my presence. The recorded information has been reviewed and considered accurate. It has been edited as necessary during review. Jonnie Kind, MD

## 2017-10-31 ENCOUNTER — Other Ambulatory Visit: Payer: Self-pay | Admitting: Obstetrics and Gynecology

## 2017-10-31 DIAGNOSIS — O10919 Unspecified pre-existing hypertension complicating pregnancy, unspecified trimester: Secondary | ICD-10-CM

## 2017-11-01 ENCOUNTER — Ambulatory Visit (INDEPENDENT_AMBULATORY_CARE_PROVIDER_SITE_OTHER): Payer: Medicaid Other

## 2017-11-01 ENCOUNTER — Ambulatory Visit (INDEPENDENT_AMBULATORY_CARE_PROVIDER_SITE_OTHER): Payer: Medicaid Other | Admitting: Advanced Practice Midwife

## 2017-11-01 VITALS — BP 136/70 | HR 86 | Wt 292.0 lb

## 2017-11-01 DIAGNOSIS — Z1389 Encounter for screening for other disorder: Secondary | ICD-10-CM | POA: Diagnosis not present

## 2017-11-01 DIAGNOSIS — A749 Chlamydial infection, unspecified: Secondary | ICD-10-CM

## 2017-11-01 DIAGNOSIS — Z3A32 32 weeks gestation of pregnancy: Secondary | ICD-10-CM

## 2017-11-01 DIAGNOSIS — Z331 Pregnant state, incidental: Secondary | ICD-10-CM | POA: Diagnosis not present

## 2017-11-01 DIAGNOSIS — O10919 Unspecified pre-existing hypertension complicating pregnancy, unspecified trimester: Secondary | ICD-10-CM

## 2017-11-01 DIAGNOSIS — O24415 Gestational diabetes mellitus in pregnancy, controlled by oral hypoglycemic drugs: Secondary | ICD-10-CM

## 2017-11-01 DIAGNOSIS — O10913 Unspecified pre-existing hypertension complicating pregnancy, third trimester: Secondary | ICD-10-CM

## 2017-11-01 DIAGNOSIS — D473 Essential (hemorrhagic) thrombocythemia: Secondary | ICD-10-CM

## 2017-11-01 DIAGNOSIS — D75839 Thrombocytosis, unspecified: Secondary | ICD-10-CM

## 2017-11-01 DIAGNOSIS — O0990 Supervision of high risk pregnancy, unspecified, unspecified trimester: Secondary | ICD-10-CM

## 2017-11-01 LAB — POCT URINALYSIS DIPSTICK
Glucose, UA: NEGATIVE
KETONES UA: NEGATIVE
Leukocytes, UA: NEGATIVE
Nitrite, UA: NEGATIVE
Protein, UA: NEGATIVE
RBC UA: NEGATIVE

## 2017-11-01 NOTE — Progress Notes (Signed)
[place] 48+2 wks,cephalic,BPP,fhr 500 bpm,posterior pl gr 2,AFI 9.5 cm,RI .62,.69,.76=83%,S/D 3.2=79%

## 2017-11-01 NOTE — Progress Notes (Signed)
[place] PREGNANCY VISIT Patient name: Tonya Bell MRN [id number]  Date of birth: October 06, 1986 Chief Complaint:   Routine Prenatal Visit (BPP today)  History of Present Illness:   Tonya Bell is a 32 y.o. [phone] female at [redacted]w[redacted]d with an Estimated Date of Delivery: 12/26/17 being seen today for ongoing management of a high-risk pregnancy complicated by chronic HTN, gestational DM, class  A2 DM.  Today she reports no complaints. Contractions: Not present. Vag. Bleeding: None.  Movement: Present. denies leaking of fluid.  Review of Systems:   Pertinent items are noted in HPI Denies abnormal vaginal discharge w/ itching/odor/irritation, headaches, visual changes, shortness of breath, chest pain, abdominal pain, severe nausea/vomiting, or problems with urination or bowel movements unless otherwise stated above.    Pertinent History Reviewed:  Medical & Surgical Hx:   Past Medical History:  Diagnosis Date  . Chlamydia   . Diabetes mellitus without complication ([place])   . Gonorrhea   . PCOS (polycystic ovarian syndrome)   . Psoriasis   . Trichomonas infection   . Vaginal Pap smear, abnormal    Past Surgical History:  Procedure Laterality Date  . CHOLECYSTECTOMY     Family History  Problem Relation Age of Onset  . Hypertension Father   . Kidney disease Mother   . Hypertension Mother   . Leukemia Paternal Grandfather   . Hypertension Paternal Grandfather   . Diabetes Paternal Grandmother   . Hypertension Maternal Uncle   . Heart attack Maternal Uncle   . Diabetes Paternal Aunt   . Hypertension Paternal Aunt   . COPD Paternal Uncle   . Hypertension Paternal Uncle   . Diabetes Paternal Uncle     Current Outpatient Medications:  .  aspirin EC 81 MG tablet, Take 1 tablet (81 mg total) by mouth daily., Disp: 30 tablet, Rfl: 6 .  labetalol (NORMODYNE) 200 MG tablet, Take 1 tablet (200 mg total) by mouth 2 (two) times daily., Disp: 60 tablet, Rfl: 3 .  metFORMIN (GLUCOPHAGE)  500 MG tablet, Take 1 tablet (500 mg total) by mouth daily with supper., Disp: 30 tablet, Rfl: 2 .  prenatal vitamin w/FE, FA (PRENATAL 1 + 1) 27-1 MG TABS tablet, Take 1 tablet by mouth daily at 12 noon., Disp: 30 each, Rfl: 12 Social History: Reviewed -  reports that she quit smoking about 5 months ago. Her smoking use included cigarettes. She smoked 0.25 packs per day. she has never used smokeless tobacco.   Physical Assessment:   Vitals:   11/01/17 0932  BP: 136/70  Pulse: 86  Weight: 292 lb (132.5 kg)  Body mass index is 48.59 kg/m.           Physical Examination:   General appearance: alert, well appearing, and in no distress  Mental status: alert, oriented to person, place, and time  Skin: warm & dry   Extremities: Edema: None    Cardiovascular: normal heart rate noted  Respiratory: normal respiratory effort, no distress  Abdomen: gravid, soft, non-tender  Pelvic: Cervical exam deferred         Fetal Status: Fetal Heart Rate (bpm): 157   Movement: Present    Fetal Surveillance Testing today: BPP Dopplers  Results for orders placed or performed in visit on 11/01/17 (from the past 24 hour(s))  POCT Urinalysis Dipstick   Collection Time: 11/01/17  9:35 AM  Result Value Ref Range   Color, UA     Clarity, UA     Glucose, UA neg  Bilirubin, UA     Ketones, UA neg    Spec Grav, UA  1.010 - 1.025   Blood, UA neg    pH, UA  5.0 - 8.0   Protein, UA neg    Urobilinogen, UA  0.2 or 1.0 E.U./dL   Nitrite, UA neg    Leukocytes, UA Negative Negative   Appearance     Odor      Assessment & Plan:  1) High-risk pregnancy G4P1021 at [redacted]w[redacted]d with an Estimated Date of Delivery: 12/26/17   2) CHTN, stable  3) A2DM, FBS 95-102; all PP <120  Labs/procedures today: [place] 33+5 wks,cephalic,BPP,fhr 456 bpm,posterior pl gr 2,AFI 9.5 cm,RI .62,.69,.76=83%,S/D 3.2=79%  Medications: Labetalol 100mg  BID; ASA 81mg  qd; increase Metformin 750 mg qhs (1.5 tablets for now:  If dose is good and  rx needs reordering, consider 850mg  tablets) Treatment Plan:  ]  Growth u/s q 3 weeks;  Weekly BPP/dopplers until 36 weeks, then add NST (Pt states she would have trouble getting here twice a week right now; testing guidelines allow for weekly BPP, so will do that until 36 weeks (as long as all is well) then pt agrees to twice weekly testing >36 weeks    Deliver @  39wks (meds):   Follow-up: Return for Wednesdays:  HROB/US (BPP/dopplers).  Orders Placed This Encounter  Procedures  . US FETAL BPP WO NON STRESS  . US OB Follow Up  . [place] UA Cord Doppler  . POCT Urinalysis Dipstick   CRESENZO-DISHMAN,Dara Beidleman CNM 11/01/2017 10:51 AM

## 2017-11-01 NOTE — Patient Instructions (Addendum)
Increase Metformin to 750mg  (1 1/2 tablets) at night.

## 2017-11-08 ENCOUNTER — Encounter: Payer: Self-pay | Admitting: Advanced Practice Midwife

## 2017-11-08 ENCOUNTER — Ambulatory Visit (INDEPENDENT_AMBULATORY_CARE_PROVIDER_SITE_OTHER): Payer: Medicaid Other | Admitting: Advanced Practice Midwife

## 2017-11-08 ENCOUNTER — Ambulatory Visit (INDEPENDENT_AMBULATORY_CARE_PROVIDER_SITE_OTHER): Payer: Medicaid Other

## 2017-11-08 VITALS — BP 138/80 | HR 94 | Wt 290.0 lb

## 2017-11-08 DIAGNOSIS — Z3A33 33 weeks gestation of pregnancy: Secondary | ICD-10-CM | POA: Diagnosis not present

## 2017-11-08 DIAGNOSIS — A749 Chlamydial infection, unspecified: Secondary | ICD-10-CM

## 2017-11-08 DIAGNOSIS — D473 Essential (hemorrhagic) thrombocythemia: Secondary | ICD-10-CM | POA: Diagnosis not present

## 2017-11-08 DIAGNOSIS — O2293 Venous complication in pregnancy, unspecified, third trimester: Secondary | ICD-10-CM

## 2017-11-08 DIAGNOSIS — O24415 Gestational diabetes mellitus in pregnancy, controlled by oral hypoglycemic drugs: Secondary | ICD-10-CM

## 2017-11-08 DIAGNOSIS — O10919 Unspecified pre-existing hypertension complicating pregnancy, unspecified trimester: Secondary | ICD-10-CM

## 2017-11-08 DIAGNOSIS — Z1389 Encounter for screening for other disorder: Secondary | ICD-10-CM | POA: Diagnosis not present

## 2017-11-08 DIAGNOSIS — O99333 Smoking (tobacco) complicating pregnancy, third trimester: Secondary | ICD-10-CM

## 2017-11-08 DIAGNOSIS — D75839 Thrombocytosis, unspecified: Secondary | ICD-10-CM

## 2017-11-08 DIAGNOSIS — Z331 Pregnant state, incidental: Secondary | ICD-10-CM | POA: Diagnosis not present

## 2017-11-08 DIAGNOSIS — O0990 Supervision of high risk pregnancy, unspecified, unspecified trimester: Secondary | ICD-10-CM

## 2017-11-08 DIAGNOSIS — F17210 Nicotine dependence, cigarettes, uncomplicated: Secondary | ICD-10-CM | POA: Diagnosis not present

## 2017-11-08 DIAGNOSIS — O10913 Unspecified pre-existing hypertension complicating pregnancy, third trimester: Secondary | ICD-10-CM

## 2017-11-08 LAB — POCT URINALYSIS DIPSTICK
Glucose, UA: NEGATIVE
Ketones, UA: NEGATIVE
Leukocytes, UA: NEGATIVE
NITRITE UA: NEGATIVE
RBC UA: NEGATIVE

## 2017-11-08 MED ORDER — METFORMIN HCL 500 MG PO TABS: 1000.0000 mg | ORAL_TABLET | Freq: Every day | ORAL | 1 refills | Status: DC

## 2017-11-08 NOTE — Progress Notes (Signed)
[place] PREGNANCY VISIT Patient name: Tonya Bell MRN [id number]  Date of birth: [birth year] Chief Complaint:   High Risk Gestation (ultrasound)  History of Present Illness:   Tonya Bell is a 32 y.o. [phone] female at [redacted]w[redacted]d with an Estimated Date of Delivery: 12/26/17 being seen today for ongoing management of a high-risk pregnancy complicated by chronic HTN, gestational DM, class  A2 DM.  Today she reports no complaints. Contractions: Not present.  .  Movement: Present. denies leaking of fluid.  Review of Systems:   Pertinent items are noted in HPI Denies abnormal vaginal discharge w/ itching/odor/irritation, headaches, visual changes, shortness of breath, chest pain, abdominal pain, severe nausea/vomiting, or problems with urination or bowel movements unless otherwise stated above.    Pertinent History Reviewed:  Medical & Surgical Hx:   Past Medical History:  Diagnosis Date  . Chlamydia   . Diabetes mellitus without complication ([place])   . Gonorrhea   . PCOS (polycystic ovarian syndrome)   . Psoriasis   . Trichomonas infection   . Vaginal Pap smear, abnormal    Past Surgical History:  Procedure Laterality Date  . CHOLECYSTECTOMY     Family History  Problem Relation Age of Onset  . Hypertension Father   . Kidney disease Mother   . Hypertension Mother   . Leukemia Paternal Grandfather   . Hypertension Paternal Grandfather   . Diabetes Paternal Grandmother   . Hypertension Maternal Uncle   . Heart attack Maternal Uncle   . Diabetes Paternal Aunt   . Hypertension Paternal Aunt   . COPD Paternal Uncle   . Hypertension Paternal Uncle   . Diabetes Paternal Uncle     Current Outpatient Medications:  .  aspirin EC 81 MG tablet, Take 1 tablet (81 mg total) by mouth daily., Disp: 30 tablet, Rfl: 6 .  labetalol (NORMODYNE) 200 MG tablet, Take 1 tablet (200 mg total) by mouth 2 (two) times daily., Disp: 60 tablet, Rfl: 3 .  metFORMIN (GLUCOPHAGE) 500 MG tablet, Take  2 tablets (1,000 mg total) by mouth daily with supper., Disp: 60 tablet, Rfl: 1 .  prenatal vitamin w/FE, FA (PRENATAL 1 + 1) 27-1 MG TABS tablet, Take 1 tablet by mouth daily at 12 noon., Disp: 30 each, Rfl: 12 Social History: Reviewed -  reports that she quit smoking about 5 months ago. Her smoking use included cigarettes. She smoked 0.25 packs per day. she has never used smokeless tobacco.   Physical Assessment:   Vitals:   11/08/17 1011  BP: 138/80  Pulse: 94  Weight: 290 lb (131.5 kg)  Body mass index is 48.26 kg/m.           Physical Examination:   General appearance: alert, well appearing, and in no distress  Mental status: alert, oriented to person, place, and time  Skin: warm & dry   Extremities: Edema: None    Cardiovascular: normal heart rate noted  Respiratory: normal respiratory effort, no distress  Abdomen: gravid, soft, non-tender  Pelvic: Cervical exam deferred         Fetal Status: Fetal Heart Rate (bpm): 154   Movement: Present    Fetal Surveillance Testing today: [place] 86+7 wks,cephalic,BPP 6/1,PJKDTOIZT pl gr 2,fhr 154 bpm,afi 14 cm,RI .64,.61=62%    Results for orders placed or performed in visit on 11/08/17 (from the past 24 hour(s))  POCT urinalysis dipstick   Collection Time: 11/08/17 10:19 AM  Result Value Ref Range   Color, UA     Clarity,  UA     Glucose, UA neg    Bilirubin, UA     Ketones, UA neg    Spec Grav, UA  1.010 - 1.025   Blood, UA neg    pH, UA  5.0 - 8.0   Protein, UA trace    Urobilinogen, UA  0.2 or 1.0 E.U./dL   Nitrite, UA neg    Leukocytes, UA Negative Negative   Appearance     Odor      Assessment & Plan:  1) High-risk pregnancy G4P1021 at [redacted]w[redacted]d with an Estimated Date of Delivery: 12/26/17   2) CHTN, stable  3) A2DM, All FBS >100 (except for 0 and 2 hr PP all <120  Labs/procedures today: [place]  Medications: Labetalol 100mg  BID; ASA 81mg  qd  Metformin increase to 1000 qhs  Treatment Plan:  Growth u/s q 3 weeks;  Weekly  BPP/dopplers until 36 weeks, then add NST (Pt states she would have trouble getting here twice a week right now; testing guidelines allow for weekly BPP, so will do that until 36 weeks (as long as all is well) then pt agrees to twice weekly testing >36 weeks    Deliver @  39wks (meds):    Follow-up: Return for As scheduled.  Orders Placed This Encounter  Procedures  . POCT urinalysis dipstick   CRESENZO-DISHMAN,Koby Hartfield CNM 11/08/2017 10:38 AM

## 2017-11-08 NOTE — Progress Notes (Signed)
[place] 52+7 wks,cephalic,BPP 7/8,EUMPNTIRW pl gr 2,fhr 154 bpm,afi 14 cm,RI .64,.61=62%

## 2017-11-15 ENCOUNTER — Ambulatory Visit (INDEPENDENT_AMBULATORY_CARE_PROVIDER_SITE_OTHER): Payer: Medicaid Other

## 2017-11-15 ENCOUNTER — Ambulatory Visit (INDEPENDENT_AMBULATORY_CARE_PROVIDER_SITE_OTHER): Payer: Medicaid Other | Admitting: Obstetrics and Gynecology

## 2017-11-15 ENCOUNTER — Encounter: Payer: Self-pay | Admitting: Obstetrics and Gynecology

## 2017-11-15 VITALS — BP 120/60 | HR 91 | Wt 293.4 lb

## 2017-11-15 DIAGNOSIS — O24415 Gestational diabetes mellitus in pregnancy, controlled by oral hypoglycemic drugs: Secondary | ICD-10-CM

## 2017-11-15 DIAGNOSIS — O10913 Unspecified pre-existing hypertension complicating pregnancy, third trimester: Secondary | ICD-10-CM

## 2017-11-15 DIAGNOSIS — Z1389 Encounter for screening for other disorder: Secondary | ICD-10-CM

## 2017-11-15 DIAGNOSIS — A749 Chlamydial infection, unspecified: Secondary | ICD-10-CM

## 2017-11-15 DIAGNOSIS — Z3A34 34 weeks gestation of pregnancy: Secondary | ICD-10-CM

## 2017-11-15 DIAGNOSIS — D473 Essential (hemorrhagic) thrombocythemia: Secondary | ICD-10-CM

## 2017-11-15 DIAGNOSIS — D75839 Thrombocytosis, unspecified: Secondary | ICD-10-CM

## 2017-11-15 DIAGNOSIS — O0990 Supervision of high risk pregnancy, unspecified, unspecified trimester: Secondary | ICD-10-CM

## 2017-11-15 DIAGNOSIS — Z331 Pregnant state, incidental: Secondary | ICD-10-CM

## 2017-11-15 DIAGNOSIS — O10919 Unspecified pre-existing hypertension complicating pregnancy, unspecified trimester: Secondary | ICD-10-CM

## 2017-11-15 LAB — POCT URINALYSIS DIPSTICK
GLUCOSE UA: NEGATIVE
Ketones, UA: NEGATIVE
LEUKOCYTES UA: NEGATIVE
Nitrite, UA: NEGATIVE
Protein, UA: NEGATIVE
RBC UA: NEGATIVE

## 2017-11-15 NOTE — Progress Notes (Signed)
[place] 16+3 wks,cephalic,BPP 8/4,YKZ 993 bpm,posterior pl gr 2,normal ovaries bilat,afi 10 cm, RI .66,.65=77% S/D 2.88 71%,EFW 2435 g 51%

## 2017-11-15 NOTE — Progress Notes (Signed)
Patient ID: Tonya Bell, female   DOB: [birth year], 32 y.o.   MRN: [id number]   [facility] PREGNANCY VISIT Patient name: Tonya Bell MRN [id number]  Date of birth: 05-20-1986 Chief Complaint:   High Risk Gestation (ultrasound)  History of Present Illness:   Tonya Bell is a 32 y.o. [phone] female at [redacted]w[redacted]d with an Estimated Date of Delivery: 12/26/17 being seen today for ongoing management of a high-risk pregnancy complicated by chronic HTN, gestational DM, class  A2 DM.  Today she reports no complaints. She has been monitoring her sugars and reports that any time her sugars are high it is because she ate something she wasn't supposed to eat.  She did not take her at bedtime Metformin last night.  Patient is advised in the future to take at least half of the dose even if her blood sugars are lower than ideal, as metformin does not cause hypoglycemia very often.  The patient has never had hypoglycemia. Contractions: Not present.  .  Movement: Present. denies leaking of fluid.  Review of Systems:   Pertinent items are noted in HPI Denies abnormal vaginal discharge w/ itching/odor/irritation, headaches, visual changes, shortness of breath, chest pain, abdominal pain, severe nausea/vomiting, or problems with urination or bowel movements unless otherwise stated above. Pertinent History Reviewed:  Reviewed past medical,surgical, social, obstetrical and family history.  Reviewed problem list, medications and allergies. Physical Assessment:   Vitals:   11/15/17 0936  BP: 120/60  Pulse: 91  Weight: 293 lb 6.4 oz (133.1 kg)  Body mass index is 48.82 kg/m.           Physical Examination:   General appearance: alert, well appearing, and in no distress, oriented to person, place, and time and overweight  Mental status: alert, oriented to person, place, and time, normal mood, behavior, speech, dress, motor activity, and thought processes, affect appropriate to mood  Skin: warm & dry    Extremities: Edema: None    Cardiovascular: normal heart rate noted  Respiratory: normal respiratory effort, no distress  Abdomen: gravid, soft, non-tender  Pelvic: Cervical exam deferred         Fetal Status: Fetal Heart Rate (bpm): 144   Movement: Present    Fetal Surveillance Testing today: U/S : [place] 12+7 wks,cephalic,BPP 5/1,ZGY 174 bpm,posterior pl gr 2,normal ovaries bilat,afi 10 cm, RI .66,.65=77% S/D 2.88 71%,EFW 2435 g 51%  Results for orders placed or performed in visit on 11/15/17 (from the past 24 hour(s))  POCT urinalysis dipstick   Collection Time: 11/15/17  9:39 AM  Result Value Ref Range   Color, UA     Clarity, UA     Glucose, UA neg    Bilirubin, UA     Ketones, UA neg    Spec Grav, UA  1.010 - 1.025   Blood, UA neg    pH, UA  5.0 - 8.0   Protein, UA neg    Urobilinogen, UA  0.2 or 1.0 E.U./dL   Nitrite, UA neg    Leukocytes, UA Negative Negative   Appearance     Odor      Assessment & Plan:  1) High-risk pregnancy G4P1021 at [redacted]w[redacted]d with an Estimated Date of Delivery: 12/26/17   2) Chronic HTN, stable  3) A2DM, stable  Meds:  1. Labetalol 100mg  BID 2. Metformin 1000mg  qhs  No orders of the defined types were placed in this encounter.   Labs/procedures today: U/S  Treatment Plan: Begin twice weekly testing alternating NST  with BPP and Dopplers  Follow-up: No Follow-up on file.  Orders Placed This Encounter  Procedures  . POCT urinalysis dipstick   By signing my name below, I, Margit Banda, attest that this documentation has been prepared under the direction and in the presence of Jonnie Kind, MD. Electronically Signed: Margit Banda, Medical Scribe. 11/15/17. 9:45 AM.  I personally performed the services described in this documentation, which was SCRIBED in my presence. The recorded information has been reviewed and considered accurate. It has been edited as necessary during review. Jonnie Kind, MD

## 2017-11-20 ENCOUNTER — Encounter: Payer: Self-pay | Admitting: Obstetrics & Gynecology

## 2017-11-20 ENCOUNTER — Ambulatory Visit (INDEPENDENT_AMBULATORY_CARE_PROVIDER_SITE_OTHER): Payer: Medicaid Other | Admitting: Obstetrics & Gynecology

## 2017-11-20 VITALS — BP 126/70 | HR 94 | Wt 296.0 lb

## 2017-11-20 DIAGNOSIS — D473 Essential (hemorrhagic) thrombocythemia: Secondary | ICD-10-CM

## 2017-11-20 DIAGNOSIS — O10913 Unspecified pre-existing hypertension complicating pregnancy, third trimester: Secondary | ICD-10-CM | POA: Diagnosis not present

## 2017-11-20 DIAGNOSIS — A749 Chlamydial infection, unspecified: Secondary | ICD-10-CM

## 2017-11-20 DIAGNOSIS — Z3A34 34 weeks gestation of pregnancy: Secondary | ICD-10-CM | POA: Diagnosis not present

## 2017-11-20 DIAGNOSIS — Z1389 Encounter for screening for other disorder: Secondary | ICD-10-CM | POA: Diagnosis not present

## 2017-11-20 DIAGNOSIS — D75839 Thrombocytosis, unspecified: Secondary | ICD-10-CM

## 2017-11-20 DIAGNOSIS — O10919 Unspecified pre-existing hypertension complicating pregnancy, unspecified trimester: Secondary | ICD-10-CM

## 2017-11-20 DIAGNOSIS — O98319 Other infections with a predominantly sexual mode of transmission complicating pregnancy, unspecified trimester: Secondary | ICD-10-CM

## 2017-11-20 DIAGNOSIS — Z331 Pregnant state, incidental: Secondary | ICD-10-CM | POA: Diagnosis not present

## 2017-11-20 DIAGNOSIS — O24415 Gestational diabetes mellitus in pregnancy, controlled by oral hypoglycemic drugs: Secondary | ICD-10-CM

## 2017-11-20 DIAGNOSIS — O0990 Supervision of high risk pregnancy, unspecified, unspecified trimester: Secondary | ICD-10-CM

## 2017-11-20 LAB — POCT URINALYSIS DIPSTICK
Blood, UA: NEGATIVE
GLUCOSE UA: NEGATIVE
Ketones, UA: NEGATIVE
Leukocytes, UA: NEGATIVE
Nitrite, UA: NEGATIVE
Protein, UA: NEGATIVE

## 2017-11-20 MED ORDER — GLYBURIDE 2.5 MG PO TABS: 2.5000 mg | ORAL_TABLET | Freq: Every day | ORAL | 3 refills | Status: DC

## 2017-11-20 NOTE — Progress Notes (Signed)
   [name] name: Tonya Bell MRN [id number]  Date of birth: July 13, 1986 Chief Complaint:   Routine Prenatal Visit (NST)  History of Present Illness:   Tonya Bell is a 32 y.o. [phone] female at [redacted]w[redacted]d with an Estimated Date of Delivery: 12/26/17 being seen today for ongoing management of a high-risk pregnancy complicated by chronic HTN, class  A2 DM.  Today she reports no complaints. Contractions: Not present. Vag. Bleeding: None.  Movement: Present. denies leaking of fluid.  Review of Systems:   Pertinent items are noted in HPI Denies abnormal vaginal discharge w/ itching/odor/irritation, headaches, visual changes, shortness of breath, chest pain, abdominal pain, severe nausea/vomiting, or problems with urination or bowel movements unless otherwise stated above. Pertinent History Reviewed:  Reviewed past medical,surgical, social, obstetrical and family history.  Reviewed problem list, medications and allergies. Physical Assessment:   Vitals:   11/20/17 0924  BP: 126/70  Pulse: 94  Weight: 296 lb (134.3 kg)  Body mass index is 49.26 kg/m.           Physical Examination:   General appearance: alert, well appearing, and in no distress  Mental status: alert, oriented to person, place, and time  Skin: warm & dry   Extremities: Edema: None    Cardiovascular: normal heart rate noted  Respiratory: normal respiratory effort, no distress  Abdomen: gravid, soft, non-tender  Pelvic: Cervical exam deferred         Fetal Status: Fetal Heart Rate (bpm): 160 Fundal Height: 37 cm Movement: Present    Fetal Surveillance Testing today: reactive NST   Results for orders placed or performed in visit on 11/20/17 (from the past 24 hour(s))  POCT Urinalysis Dipstick   Collection Time: 11/20/17  9:32 AM  Result Value Ref Range   Color, UA     Clarity, UA     Glucose, UA neg    Bilirubin, UA     Ketones, UA neg    Spec Grav, UA  1.010 - 1.025   Blood, UA neg     pH, UA  5.0 - 8.0   Protein, UA neg    Urobilinogen, UA  0.2 or 1.0 E.U./dL   Nitrite, UA neg    Leukocytes, UA Negative Negative   Appearance     Odor      Assessment & Plan:  1) High-risk pregnancy G4P1021 at [redacted]w[redacted]d with an Estimated Date of Delivery: 12/26/17   2) Class A2 DM, unstable, decrease metformin to 500 at supper, add glyburide 2.5 mg at bedtime  3) CHTN, stable, on labetalol 100 BID  Meds:  Meds ordered this encounter  Medications  . glyBURIDE (DIABETA) 2.5 MG tablet    Sig: Take 1 tablet (2.5 mg total) by mouth at bedtime.    Dispense:  30 tablet    Refill:  3    Labs/procedures today: Reactive NST  Treatment Plan:  Twice weekly surveillance, sonogram alternating with NST, induction at 39 weeks or as clinically indicated   Reviewed: Preterm labor symptoms and general obstetric precautions including but not limited to vaginal bleeding, contractions, leaking of fluid and fetal movement were reviewed in detail with the patient.  All questions were answered.  Follow-up: Return in about 3 days (around 11/23/2017) for BPP/sono, HROB.  Orders Placed This Encounter  Procedures  . POCT Urinalysis Dipstick   Florian Buff  11/20/2017 10:17 AM

## 2017-11-20 NOTE — Progress Notes (Signed)
37

## 2017-11-22 ENCOUNTER — Other Ambulatory Visit: Payer: Medicaid Other

## 2017-11-22 ENCOUNTER — Encounter: Payer: Medicaid Other | Admitting: Women's Health

## 2017-11-23 ENCOUNTER — Encounter: Payer: Self-pay | Admitting: Women's Health

## 2017-11-23 ENCOUNTER — Ambulatory Visit (INDEPENDENT_AMBULATORY_CARE_PROVIDER_SITE_OTHER): Payer: Medicaid Other

## 2017-11-23 ENCOUNTER — Ambulatory Visit (INDEPENDENT_AMBULATORY_CARE_PROVIDER_SITE_OTHER): Payer: Medicaid Other | Admitting: Women's Health

## 2017-11-23 VITALS — BP 132/60 | HR 85 | Wt 296.5 lb

## 2017-11-23 DIAGNOSIS — O0990 Supervision of high risk pregnancy, unspecified, unspecified trimester: Secondary | ICD-10-CM

## 2017-11-23 DIAGNOSIS — D75839 Thrombocytosis, unspecified: Secondary | ICD-10-CM

## 2017-11-23 DIAGNOSIS — O10919 Unspecified pre-existing hypertension complicating pregnancy, unspecified trimester: Secondary | ICD-10-CM

## 2017-11-23 DIAGNOSIS — O10913 Unspecified pre-existing hypertension complicating pregnancy, third trimester: Secondary | ICD-10-CM

## 2017-11-23 DIAGNOSIS — Z1389 Encounter for screening for other disorder: Secondary | ICD-10-CM

## 2017-11-23 DIAGNOSIS — A749 Chlamydial infection, unspecified: Secondary | ICD-10-CM

## 2017-11-23 DIAGNOSIS — O24419 Gestational diabetes mellitus in pregnancy, unspecified control: Secondary | ICD-10-CM

## 2017-11-23 DIAGNOSIS — D473 Essential (hemorrhagic) thrombocythemia: Secondary | ICD-10-CM

## 2017-11-23 DIAGNOSIS — Z3A35 35 weeks gestation of pregnancy: Secondary | ICD-10-CM

## 2017-11-23 DIAGNOSIS — O24415 Gestational diabetes mellitus in pregnancy, controlled by oral hypoglycemic drugs: Secondary | ICD-10-CM

## 2017-11-23 DIAGNOSIS — O0993 Supervision of high risk pregnancy, unspecified, third trimester: Secondary | ICD-10-CM

## 2017-11-23 DIAGNOSIS — Z331 Pregnant state, incidental: Secondary | ICD-10-CM

## 2017-11-23 LAB — POCT URINALYSIS DIPSTICK
Glucose, UA: NEGATIVE
KETONES UA: NEGATIVE
Leukocytes, UA: NEGATIVE
Nitrite, UA: NEGATIVE
Protein, UA: NEGATIVE
RBC UA: NEGATIVE

## 2017-11-23 NOTE — Progress Notes (Signed)
   [name] name: Tonya Bell MRN [id number]  Date of birth: Sep 06, 1986 Chief Complaint:   High Risk Gestation ([place] today)  History of Present Illness:   Tonya Bell is a 32 y.o. [phone] female at [redacted]w[redacted]d with an Estimated Date of Delivery: 12/26/17 being seen today for ongoing management of a high-risk pregnancy complicated by [facility] on labetalol 200mg  BID, A2DM on metformin 500mg  @ supper, glyburide 2.5mg  qhs.  Today she reports no complaints. FBS 87-94, 2hr pp 108-125 (only 1 >120, had soda). Contractions: Not present. Vag. Bleeding: None.  Movement: Present. denies leaking of fluid.  Review of Systems:   Pertinent items are noted in HPI Denies abnormal vaginal discharge w/ itching/odor/irritation, headaches, visual changes, shortness of breath, chest pain, abdominal pain, severe nausea/vomiting, or problems with urination or bowel movements unless otherwise stated above. Pertinent History Reviewed:  Reviewed past medical,surgical, social, obstetrical and family history.  Reviewed problem list, medications and allergies. Physical Assessment:   Vitals:   11/23/17 0906  BP: 132/60  Pulse: 85  Weight: 296 lb 8 oz (134.5 kg)  Body mass index is 49.34 kg/m.           Physical Examination:   General appearance: alert, well appearing, and in no distress  Mental status: alert, oriented to person, place, and time  Skin: warm & dry   Extremities: Edema: None    Cardiovascular: normal heart rate noted  Respiratory: normal respiratory effort, no distress  Abdomen: gravid, soft, non-tender  Pelvic: Cervical exam deferred         Fetal Status: Fetal Heart Rate (bpm): 146 u/s Fundal Height: 35 cm Movement: Present    Fetal Surveillance Testing today: [place] 56+8 wks,cephalic,BPP 8/8 ,normal ovaries bilat,fhr 146 bpm,posterior pl gr 2,afi 10 cm,RI .66,.54,.59=57%   Results for orders placed or performed in visit on 11/23/17 (from the past 24 hour(s))  POCT  urinalysis dipstick   Collection Time: 11/23/17  9:07 AM  Result Value Ref Range   Color, UA     Clarity, UA     Glucose, UA neg    Bilirubin, UA     Ketones, UA neg    Spec Grav, UA  1.010 - 1.025   Blood, UA neg    pH, UA  5.0 - 8.0   Protein, UA neg    Urobilinogen, UA  0.2 or 1.0 E.U./dL   Nitrite, UA neg    Leukocytes, UA Negative Negative   Appearance     Odor      Assessment & Plan:  1) High-risk pregnancy G4P1021 at [redacted]w[redacted]d with an Estimated Date of Delivery: 12/26/17   2) CHTN, stable, continue Labetalol 100mg  BID, baby asa  3) A2DM, stable, continue metformin 500mg  w/ supper, glyburide 2.5mg  q hs  Meds: No orders of the defined types were placed in this encounter.   Labs/procedures today: u/s  Treatment Plan:   Growth u/s @ 37-38wks     2x/wk testing nst/sono @ 32wks or weekly BPP   Deliver @ 39wks (meds)   Reviewed: Preterm labor symptoms and general obstetric precautions including but not limited to vaginal bleeding, contractions, leaking of fluid and fetal movement were reviewed in detail with the patient.  Recommended Tdap at HD/PCP per CDC guidelines. All questions were answered.  Follow-up: Return for As scheduled Mon for HROB/NST.  Orders Placed This Encounter  Procedures  . POCT urinalysis dipstick   [name] CNM, [facility] 11/23/2017 9:53 AM

## 2017-11-23 NOTE — Patient Instructions (Signed)
[place], I greatly value your feedback.  If you receive a survey following your visit with [place] today, we appreciate you taking the time to fill it out.  Thanks, Knute Neu, CNM, WHNP-BC   Call the office [phone]) or go to [facility] if:  You begin to have strong, frequent contractions  Your water breaks.  Sometimes it is a big gush of fluid, sometimes it is just a trickle that keeps getting your panties wet or running down your legs  You have vaginal bleeding.  It is normal to have a small amount of spotting if your cervix was checked.   You don't feel your baby moving like normal.  If you don't, get you something to eat and drink and lay down and focus on feeling your baby move.  You should feel at least 10 movements in 2 hours.  If you don't, you should call the office or go to [facility].    Tdap Vaccine  It is recommended that you get the Tdap vaccine during the third trimester of EACH pregnancy to help protect your baby from getting pertussis (whooping cough)  27-36 weeks is the BEST time to do this so that you can pass the protection on to your baby. During pregnancy is better than after pregnancy, but if you are unable to get it during pregnancy it will be offered at the hospital.   You can get this vaccine at the health department or your family doctor  Everyone who will be around your baby should also be up-to-date on their vaccines. Adults (who are not pregnant) only need 1 dose of Tdap during adulthood.   Third Trimester of Pregnancy The third trimester is from week 29 through week 42, months 7 through 9. The third trimester is a time when the fetus is growing rapidly. At the end of the ninth month, the fetus is about 20 inches in length and weighs 6-10 pounds.  BODY CHANGES Your body goes through many changes during pregnancy. The changes vary from woman to woman.   Your weight will continue to increase. You can expect to gain 25-35 pounds (11-16 kg)  by the end of the pregnancy.  You may begin to get stretch marks on your hips, abdomen, and breasts.  You may urinate more often because the fetus is moving lower into your pelvis and pressing on your bladder.  You may develop or continue to have heartburn as a result of your pregnancy.  You may develop constipation because certain hormones are causing the muscles that push waste through your intestines to slow down.  You may develop hemorrhoids or swollen, bulging veins (varicose veins).  You may have pelvic pain because of the weight gain and pregnancy hormones relaxing your joints between the bones in your pelvis. Backaches may result from overexertion of the muscles supporting your posture.  You may have changes in your hair. These can include thickening of your hair, rapid growth, and changes in texture. Some women also have hair loss during or after pregnancy, or hair that feels dry or thin. Your hair will most likely return to normal after your baby is born.  Your breasts will continue to grow and be tender. A yellow discharge may leak from your breasts called colostrum.  Your belly button may stick out.  You may feel short of breath because of your expanding uterus.  You may notice the fetus "dropping," or moving lower in your abdomen.  You may have a bloody  mucus discharge. This usually occurs a few days to a week before labor begins.  Your cervix becomes thin and soft (effaced) near your due date. WHAT TO EXPECT AT YOUR PRENATAL EXAMS  You will have prenatal exams every 2 weeks until week 36. Then, you will have weekly prenatal exams. During a routine prenatal visit:  You will be weighed to make sure you and the fetus are growing normally.  Your blood pressure is taken.  Your abdomen will be measured to track your baby's growth.  The fetal heartbeat will be listened to.  Any test results from the previous visit will be discussed.  You may have a cervical check near  your due date to see if you have effaced. At around 36 weeks, your caregiver will check your cervix. At the same time, your caregiver will also perform a test on the secretions of the vaginal tissue. This test is to determine if a type of bacteria, Group B streptococcus, is present. Your caregiver will explain this further. Your caregiver may ask you:  What your birth plan is.  How you are feeling.  If you are feeling the baby move.  If you have had any abnormal symptoms, such as leaking fluid, bleeding, severe headaches, or abdominal cramping.  If you have any questions. Other tests or screenings that may be performed during your third trimester include:  Blood tests that check for low iron levels (anemia).  Fetal testing to check the health, activity level, and growth of the fetus. Testing is done if you have certain medical conditions or if there are problems during the pregnancy. FALSE LABOR You may feel small, irregular contractions that eventually go away. These are called Braxton Hicks contractions, or false labor. Contractions may last for hours, days, or even weeks before true labor sets in. If contractions come at regular intervals, intensify, or become painful, it is best to be seen by your caregiver.  SIGNS OF LABOR   Menstrual-like cramps.  Contractions that are 5 minutes apart or less.  Contractions that start on the top of the uterus and spread down to the lower abdomen and back.  A sense of increased pelvic pressure or back pain.  A watery or bloody mucus discharge that comes from the vagina. If you have any of these signs before the 37th week of pregnancy, call your caregiver right away. You need to go to the hospital to get checked immediately. HOME CARE INSTRUCTIONS   Avoid all smoking, herbs, alcohol, and unprescribed drugs. These chemicals affect the formation and growth of the baby.  Follow your caregiver's instructions regarding medicine use. There are  medicines that are either safe or unsafe to take during pregnancy.  Exercise only as directed by your caregiver. Experiencing uterine cramps is a good sign to stop exercising.  Continue to eat regular, healthy meals.  Wear a good support bra for breast tenderness.  Do not use hot tubs, steam rooms, or saunas.  Wear your seat belt at all times when driving.  Avoid raw meat, uncooked cheese, cat litter boxes, and soil used by cats. These carry germs that can cause birth defects in the baby.  Take your prenatal vitamins.  Try taking a stool softener (if your caregiver approves) if you develop constipation. Eat more high-fiber foods, such as fresh vegetables or fruit and whole grains. Drink plenty of fluids to keep your urine clear or pale yellow.  Take warm sitz baths to soothe any pain or discomfort caused by hemorrhoids.  Use hemorrhoid cream if your caregiver approves.  If you develop varicose veins, wear support hose. Elevate your feet for 15 minutes, 3-4 times a day. Limit salt in your diet.  Avoid heavy lifting, wear low heal shoes, and practice good posture.  Rest a lot with your legs elevated if you have leg cramps or low back pain.  Visit your dentist if you have not gone during your pregnancy. Use a soft toothbrush to brush your teeth and be gentle when you floss.  A sexual relationship may be continued unless your caregiver directs you otherwise.  Do not travel far distances unless it is absolutely necessary and only with the approval of your caregiver.  Take prenatal classes to understand, practice, and ask questions about the labor and delivery.  Make a trial run to the hospital.  Pack your hospital bag.  Prepare the baby's nursery.  Continue to go to all your prenatal visits as directed by your caregiver. SEEK MEDICAL CARE IF:  You are unsure if you are in labor or if your water has broken.  You have dizziness.  You have mild pelvic cramps, pelvic pressure, or  nagging pain in your abdominal area.  You have persistent nausea, vomiting, or diarrhea.  You have a bad smelling vaginal discharge.  You have pain with urination. SEEK IMMEDIATE MEDICAL CARE IF:   You have a fever.  You are leaking fluid from your vagina.  You have spotting or bleeding from your vagina.  You have severe abdominal cramping or pain.  You have rapid weight loss or gain.  You have shortness of breath with chest pain.  You notice sudden or extreme swelling of your face, hands, ankles, feet, or legs.  You have not felt your baby move in over an hour.  You have severe headaches that do not go away with medicine.  You have vision changes. Document Released: 09/27/2001 Document Revised: 10/08/2013 Document Reviewed: 12/04/2012 [facility] Patient Information 2015 [place], [place]. This information is not intended to replace advice given to you by your health care provider. Make sure you discuss any questions you have with your health care provider.

## 2017-11-23 NOTE — Progress Notes (Signed)
[place] 55+2 wks,cephalic,BPP 8/8 ,normal ovaries bilat,fhr 146 bpm,posterior pl gr 2,afi 10 cm,RI .66,.54,.59=57%

## 2017-11-27 ENCOUNTER — Encounter: Payer: Self-pay | Admitting: Women's Health

## 2017-11-27 ENCOUNTER — Ambulatory Visit (INDEPENDENT_AMBULATORY_CARE_PROVIDER_SITE_OTHER): Payer: Medicaid Other | Admitting: Women's Health

## 2017-11-27 VITALS — BP 130/60 | HR 92 | Wt 292.0 lb

## 2017-11-27 DIAGNOSIS — O10913 Unspecified pre-existing hypertension complicating pregnancy, third trimester: Secondary | ICD-10-CM

## 2017-11-27 DIAGNOSIS — A749 Chlamydial infection, unspecified: Secondary | ICD-10-CM | POA: Diagnosis not present

## 2017-11-27 DIAGNOSIS — O24415 Gestational diabetes mellitus in pregnancy, controlled by oral hypoglycemic drugs: Secondary | ICD-10-CM

## 2017-11-27 DIAGNOSIS — D473 Essential (hemorrhagic) thrombocythemia: Secondary | ICD-10-CM | POA: Diagnosis not present

## 2017-11-27 DIAGNOSIS — O0993 Supervision of high risk pregnancy, unspecified, third trimester: Secondary | ICD-10-CM

## 2017-11-27 DIAGNOSIS — O99113 Other diseases of the blood and blood-forming organs and certain disorders involving the immune mechanism complicating pregnancy, third trimester: Secondary | ICD-10-CM | POA: Diagnosis not present

## 2017-11-27 DIAGNOSIS — D75839 Thrombocytosis, unspecified: Secondary | ICD-10-CM

## 2017-11-27 DIAGNOSIS — O0990 Supervision of high risk pregnancy, unspecified, unspecified trimester: Secondary | ICD-10-CM

## 2017-11-27 DIAGNOSIS — Z1389 Encounter for screening for other disorder: Secondary | ICD-10-CM | POA: Diagnosis not present

## 2017-11-27 DIAGNOSIS — O10919 Unspecified pre-existing hypertension complicating pregnancy, unspecified trimester: Secondary | ICD-10-CM

## 2017-11-27 DIAGNOSIS — O98313 Other infections with a predominantly sexual mode of transmission complicating pregnancy, third trimester: Secondary | ICD-10-CM | POA: Diagnosis not present

## 2017-11-27 DIAGNOSIS — O24419 Gestational diabetes mellitus in pregnancy, unspecified control: Secondary | ICD-10-CM

## 2017-11-27 DIAGNOSIS — Z3A35 35 weeks gestation of pregnancy: Secondary | ICD-10-CM

## 2017-11-27 DIAGNOSIS — Z331 Pregnant state, incidental: Secondary | ICD-10-CM | POA: Diagnosis not present

## 2017-11-27 LAB — POCT URINALYSIS DIPSTICK
GLUCOSE UA: NEGATIVE
Ketones, UA: NEGATIVE
LEUKOCYTES UA: NEGATIVE
Nitrite, UA: NEGATIVE
Protein, UA: NEGATIVE
RBC UA: NEGATIVE

## 2017-11-27 NOTE — Patient Instructions (Signed)
[place], I greatly value your feedback.  If you receive a survey following your visit with [place] today, we appreciate you taking the time to fill it out.  Thanks, Knute Neu, CNM, WHNP-BC   Call the office [phone]) or go to [facility] if:  You begin to have strong, frequent contractions  Your water breaks.  Sometimes it is a big gush of fluid, sometimes it is just a trickle that keeps getting your panties wet or running down your legs  You have vaginal bleeding.  It is normal to have a small amount of spotting if your cervix was checked.   You don't feel your baby moving like normal.  If you don't, get you something to eat and drink and lay down and focus on feeling your baby move.  You should feel at least 10 movements in 2 hours.  If you don't, you should call the office or go to [place] and Birth Information The normal length of a pregnancy is 39-41 weeks. Preterm labor is when labor starts before 37 completed weeks of pregnancy. What are the risk factors for preterm labor? Preterm labor is more likely to occur in women who:  Have certain infections during pregnancy such as a bladder infection, sexually transmitted infection, or infection inside the uterus (chorioamnionitis).  Have a shorter-than-normal cervix.  Have gone into preterm labor before.  Have had surgery on their cervix.  Are younger than age 2 or older than age 43.  Are African American.  Are pregnant with twins or multiple babies (multiple gestation).  Take street drugs or smoke while pregnant.  Do not gain enough weight while pregnant.  Became pregnant shortly after having been pregnant.  What are the symptoms of preterm labor? Symptoms of preterm labor include:  Cramps similar to those that can happen during a menstrual period. The cramps may happen with diarrhea.  Pain in the abdomen or lower back.  Regular uterine contractions that may feel like  tightening of the abdomen.  A feeling of increased pressure in the pelvis.  Increased watery or bloody mucus discharge from the vagina.  Water breaking (ruptured amniotic sac).  Why is it important to recognize signs of preterm labor? It is important to recognize signs of preterm labor because babies who are born prematurely may not be fully developed. This can put them at an increased risk for:  Long-term (chronic) heart and lung problems.  Difficulty immediately after birth with regulating body systems, including blood sugar, body temperature, heart rate, and breathing rate.  Bleeding in the brain.  Cerebral palsy.  Learning difficulties.  Death.  These risks are highest for babies who are born before 48 weeks of pregnancy. How is preterm labor treated? Treatment depends on the length of your pregnancy, your condition, and the health of your baby. It may involve:  Having a stitch (suture) placed in your cervix to prevent your cervix from opening too early (cerclage).  Taking or being given medicines, such as: ? Hormone medicines. These may be given early in pregnancy to help support the pregnancy. ? Medicine to stop contractions. ? Medicines to help mature the baby's lungs. These may be prescribed if the risk of delivery is high. ? Medicines to prevent your baby from developing cerebral palsy.  If the labor happens before 34 weeks of pregnancy, you may need to stay in the hospital. What should I do if I think I am in preterm labor? If  you think that you are going into preterm labor, call your health care provider right away. How can I prevent preterm labor in future pregnancies? To increase your chance of having a full-term pregnancy:  Do not use any tobacco products, such as cigarettes, chewing tobacco, and e-cigarettes. If you need help quitting, ask your health care provider.  Do not use street drugs or medicines that have not been prescribed to you during your  pregnancy.  Talk with your health care provider before taking any herbal supplements, even if you have been taking them regularly.  Make sure you gain a healthy amount of weight during your pregnancy.  Watch for infection. If you think that you might have an infection, get it checked right away.  Make sure to tell your health care provider if you have gone into preterm labor before.  This information is not intended to replace advice given to you by your health care provider. Make sure you discuss any questions you have with your health care provider. Document Released: 12/24/2003 Document Revised: 03/15/2016 Document Reviewed: 02/24/2016 Elsevier Interactive Patient Education  2018 [employer].

## 2017-11-27 NOTE — Progress Notes (Signed)
[name] name: Tonya Bell MRN [id number]  Date of birth: April 02, 1986 Chief Complaint:   High Risk Gestation (NST)  History of Present Illness:   Tonya Bell is a 32 y.o. [phone] female at [redacted]w[redacted]d with an Estimated Date of Delivery: 12/26/17 being seen today for ongoing management of a high-risk pregnancy complicated by [facility] on Labetalol 100mg  BID, A2DM on metformin 500mg  @ supper, glyburide 2.5mg  qhs.  Today she reports fbs all <95, 2hr pp 101-124 (only 2 >120, had a 178- but states she checked sugar immediately after eating for some reason and then didn't recheck 2hr pp). Contractions: Not present. Vag. Bleeding: None.  Movement: Present. denies leaking of fluid.  Review of Systems:   Pertinent items are noted in HPI Denies abnormal vaginal discharge w/ itching/odor/irritation, headaches, visual changes, shortness of breath, chest pain, abdominal pain, severe nausea/vomiting, or problems with urination or bowel movements unless otherwise stated above. Pertinent History Reviewed:  Reviewed past medical,surgical, social, obstetrical and family history.  Reviewed problem list, medications and allergies. Physical Assessment:   Vitals:   11/27/17 0914  BP: 130/60  Pulse: 92  Weight: 292 lb (132.5 kg)  Body mass index is 48.59 kg/m.           Physical Examination:   General appearance: alert, well appearing, and in no distress  Mental status: alert, oriented to person, place, and time  Skin: warm & dry   Extremities: Edema: None    Cardiovascular: normal heart rate noted  Respiratory: normal respiratory effort, no distress  Abdomen: gravid, soft, non-tender  Pelvic: Cervical exam deferred         Fetal Status: Fetal Heart Rate (bpm): 150 Fundal Height: 35 cm Movement: Present    Fetal Surveillance Testing today: NST: FHR baseline 150 bpm, Variability: moderate, Accelerations:present, Decelerations:  Absent= Cat 1/Reactive Toco: none    Results  for orders placed or performed in visit on 11/27/17 (from the past 24 hour(s))  POCT urinalysis dipstick   Collection Time: 11/27/17  9:12 AM  Result Value Ref Range   Color, UA     Clarity, UA     Glucose, UA neg    Bilirubin, UA     Ketones, UA neg    Spec Grav, UA  1.010 - 1.025   Blood, UA neg    pH, UA  5.0 - 8.0   Protein, UA neg    Urobilinogen, UA  0.2 or 1.0 E.U./dL   Nitrite, UA neg    Leukocytes, UA Negative Negative   Appearance     Odor      Assessment & Plan:  1) High-risk pregnancy G4P1021 at [redacted]w[redacted]d with an Estimated Date of Delivery: 12/26/17   2) CHTN, stable, continue Labetalol 100mg  BID, baby asa  3) A2DM, stable, continue metformin 500mg  w/ supper, glyburide 2.5mg  q hs  Meds: No orders of the defined types were placed in this encounter.   Labs/procedures today: nst  Treatment Plan:  Growth u/s @ 37-38wks, 2x/wk testing nst/sono, deliver @ 39wks  Reviewed: Preterm labor symptoms and general obstetric precautions including but not limited to vaginal bleeding, contractions, leaking of fluid and fetal movement were reviewed in detail with the patient.  All questions were answered.  Follow-up: Return for As scheduled Thurs for HROB, bpp/dopp.  Orders Placed This Encounter  Procedures  . US FETAL BPP WO NON STRESS  . [place] UA Cord Doppler  . POCT urinalysis dipstick   Roma Schanz CNM, WHNP-BC  11/27/2017 10:12 AM

## 2017-11-29 ENCOUNTER — Encounter: Payer: Medicaid Other | Admitting: Advanced Practice Midwife

## 2017-11-29 ENCOUNTER — Other Ambulatory Visit: Payer: Medicaid Other

## 2017-11-30 ENCOUNTER — Ambulatory Visit (INDEPENDENT_AMBULATORY_CARE_PROVIDER_SITE_OTHER): Payer: Medicaid Other | Admitting: Advanced Practice Midwife

## 2017-11-30 ENCOUNTER — Encounter: Payer: Self-pay | Admitting: Advanced Practice Midwife

## 2017-11-30 ENCOUNTER — Ambulatory Visit (INDEPENDENT_AMBULATORY_CARE_PROVIDER_SITE_OTHER): Payer: Medicaid Other

## 2017-11-30 VITALS — BP 120/60 | HR 92 | Wt 292.0 lb

## 2017-11-30 DIAGNOSIS — Z331 Pregnant state, incidental: Secondary | ICD-10-CM

## 2017-11-30 DIAGNOSIS — D75839 Thrombocytosis, unspecified: Secondary | ICD-10-CM

## 2017-11-30 DIAGNOSIS — Z3A36 36 weeks gestation of pregnancy: Secondary | ICD-10-CM

## 2017-11-30 DIAGNOSIS — O10913 Unspecified pre-existing hypertension complicating pregnancy, third trimester: Secondary | ICD-10-CM

## 2017-11-30 DIAGNOSIS — O24415 Gestational diabetes mellitus in pregnancy, controlled by oral hypoglycemic drugs: Secondary | ICD-10-CM

## 2017-11-30 DIAGNOSIS — R3129 Other microscopic hematuria: Secondary | ICD-10-CM

## 2017-11-30 DIAGNOSIS — O2343 Unspecified infection of urinary tract in pregnancy, third trimester: Secondary | ICD-10-CM

## 2017-11-30 DIAGNOSIS — Z1389 Encounter for screening for other disorder: Secondary | ICD-10-CM | POA: Diagnosis not present

## 2017-11-30 DIAGNOSIS — O0993 Supervision of high risk pregnancy, unspecified, third trimester: Secondary | ICD-10-CM

## 2017-11-30 DIAGNOSIS — O24419 Gestational diabetes mellitus in pregnancy, unspecified control: Secondary | ICD-10-CM

## 2017-11-30 DIAGNOSIS — O10919 Unspecified pre-existing hypertension complicating pregnancy, unspecified trimester: Secondary | ICD-10-CM

## 2017-11-30 DIAGNOSIS — O0990 Supervision of high risk pregnancy, unspecified, unspecified trimester: Secondary | ICD-10-CM

## 2017-11-30 DIAGNOSIS — A749 Chlamydial infection, unspecified: Secondary | ICD-10-CM

## 2017-11-30 DIAGNOSIS — D473 Essential (hemorrhagic) thrombocythemia: Secondary | ICD-10-CM

## 2017-11-30 LAB — POCT URINALYSIS DIPSTICK
GLUCOSE UA: NEGATIVE
Ketones, UA: NEGATIVE
LEUKOCYTES UA: NEGATIVE
NITRITE UA: NEGATIVE
PROTEIN UA: NEGATIVE
RBC UA: 3

## 2017-11-30 NOTE — Progress Notes (Signed)
[place] 99+3 wks,cephalic,BPP 7/1,IRCVELFYB pl gr 3,fhr 146 bpm,afi 13.7 cm,RI .66,.63=82%

## 2017-11-30 NOTE — Patient Instructions (Signed)
Cervical Ripening: May try one or both  Red Raspberry Leaf capsules: two 300mg or 400mg tablets with each meal, 2-3 times a day  Potential Side Effects Of Raspberry Leaf:  Most women do not experience any side effects from drinking raspberry leaf tea. However, nausea and loose stools are possible   Evening Primrose Oil capsules: 3 500 mg capsules daily.  Some of the potential side effects:  Upset stomach  Loose stools or diarrhea  Headaches  Nausea:     

## 2017-11-30 NOTE — Progress Notes (Signed)
[name] name: Tonya Bell MRN [id number]  Date of birth: 1986-03-03 Chief Complaint:   High Risk Gestation (ultrasound)  History of Present Illness:   Tonya Bell is a 32 y.o. [phone] female at [redacted]w[redacted]d with an Estimated Date of Delivery: 12/26/17 being seen today for ongoing management of a high-risk pregnancy complicated byCHTN on Labetalol 100mg  BID, A2DM on metformin 500mg  @ supper, glyburide 2.5mg  qhs.   .  Today she reports no complaints. Contractions: Not present.  .  Movement: Present. denies leaking of fluid.  Review of Systems:   Pertinent items are noted in HPI Denies abnormal vaginal discharge w/ itching/odor/irritation, headaches, visual changes, shortness of breath, chest pain, abdominal pain, severe nausea/vomiting, or problems with urination or bowel movements unless otherwise stated above.    Pertinent History Reviewed:  Medical & Surgical Hx:   Past Medical History:  Diagnosis Date  . Chlamydia   . Diabetes mellitus without complication ([place])   . Gonorrhea   . PCOS (polycystic ovarian syndrome)   . Psoriasis   . Trichomonas infection   . Vaginal Pap smear, abnormal    Past Surgical History:  Procedure Laterality Date  . CHOLECYSTECTOMY     Family History  Problem Relation Age of Onset  . Hypertension Father   . Kidney disease Mother   . Hypertension Mother   . Leukemia Paternal Grandfather   . Hypertension Paternal Grandfather   . Diabetes Paternal Grandmother   . Hypertension Maternal Uncle   . Heart attack Maternal Uncle   . Diabetes Paternal Aunt   . Hypertension Paternal Aunt   . COPD Paternal Uncle   . Hypertension Paternal Uncle   . Diabetes Paternal Uncle     Current Outpatient Medications:  .  aspirin EC 81 MG tablet, Take 1 tablet (81 mg total) by mouth daily., Disp: 30 tablet, Rfl: 6 .  glyBURIDE (DIABETA) 2.5 MG tablet, Take 1 tablet (2.5 mg total) by mouth at bedtime., Disp: 30 tablet, Rfl: 3 .   labetalol (NORMODYNE) 200 MG tablet, Take 1 tablet (200 mg total) by mouth 2 (two) times daily., Disp: 60 tablet, Rfl: 3 .  metFORMIN (GLUCOPHAGE) 500 MG tablet, Take 2 tablets (1,000 mg total) by mouth daily with supper. (Patient taking differently: Take 500 mg by mouth daily with supper. ), Disp: 60 tablet, Rfl: 1 .  prenatal vitamin w/FE, FA (PRENATAL 1 + 1) 27-1 MG TABS tablet, Take 1 tablet by mouth daily at 12 noon., Disp: 30 each, Rfl: 12 Social History: Reviewed -  reports that she quit smoking about 6 months ago. Her smoking use included cigarettes. She smoked 0.25 packs per day. she has never used smokeless tobacco.   Physical Assessment:   Vitals:   11/30/17 1016  BP: 120/60  Pulse: 92  Weight: 292 lb (132.5 kg)  Body mass index is 48.59 kg/m.           Physical Examination:   General appearance: alert, well appearing, and in no distress  Mental status: alert, oriented to person, place, and time  Skin: warm & dry   Extremities: Edema: None    Cardiovascular: normal heart rate noted  Respiratory: normal respiratory effort, no distress  Abdomen: gravid, soft, non-tender  Pelvic: Cervical exam deferred         Fetal Status:     Movement: Present    Fetal Surveillance Testing today: [place] 37+6 wks,cephalic,BPP 2/8,BTDVVOHYW pl gr 3,fhr 146 bpm,afi 13.7 cm,RI .66,.63=82%    Results  for orders placed or performed in visit on 11/30/17 (from the past 24 hour(s))  POCT urinalysis dipstick   Collection Time: 11/30/17 10:23 AM  Result Value Ref Range   Color, UA     Clarity, UA     Glucose, UA neg    Bilirubin, UA     Ketones, UA neg    Spec Grav, UA  1.010 - 1.025   Blood, UA 3    pH, UA  5.0 - 8.0   Protein, UA neg    Urobilinogen, UA  0.2 or 1.0 E.U./dL   Nitrite, UA neg    Leukocytes, UA Negative Negative   Appearance     Odor      Assessment & Plan:  1) High-risk pregnancy G4P1021 at [redacted]w[redacted]d with an Estimated Date of Delivery: 12/26/17   2) CHTN, stable  3) A2DM,  stable; all FBS <95 and 2hr < 120  Labs/procedures today: none  Medications: continue Labetalol 100mg  BID, baby asa metformin 500mg  w/ supper, glyburide 2.5mg  q hs  Treatment Plan:  Growth u/s @ 37-38wks, 2x/wk testing nst/sono, deliver @ 39wks  Discussed IOL at 39 weeks;  Ok wi/that but if we can strip her membranes at 38.6, wants to try that  Follow-up: Return for As scheduled.  Orders Placed This Encounter  Procedures  . Urine Culture  . POCT urinalysis dipstick   Tonya Bell CNM 11/30/2017 11:11 AM

## 2017-12-02 LAB — URINE CULTURE

## 2017-12-04 ENCOUNTER — Encounter: Payer: Self-pay | Admitting: Obstetrics & Gynecology

## 2017-12-04 ENCOUNTER — Ambulatory Visit (INDEPENDENT_AMBULATORY_CARE_PROVIDER_SITE_OTHER): Payer: Medicaid Other | Admitting: Obstetrics & Gynecology

## 2017-12-04 VITALS — BP 130/70 | HR 95 | Wt 295.0 lb

## 2017-12-04 DIAGNOSIS — O10913 Unspecified pre-existing hypertension complicating pregnancy, third trimester: Secondary | ICD-10-CM | POA: Diagnosis not present

## 2017-12-04 DIAGNOSIS — A749 Chlamydial infection, unspecified: Secondary | ICD-10-CM

## 2017-12-04 DIAGNOSIS — D75839 Thrombocytosis, unspecified: Secondary | ICD-10-CM

## 2017-12-04 DIAGNOSIS — Z3A36 36 weeks gestation of pregnancy: Secondary | ICD-10-CM

## 2017-12-04 DIAGNOSIS — Z1389 Encounter for screening for other disorder: Secondary | ICD-10-CM | POA: Diagnosis not present

## 2017-12-04 DIAGNOSIS — Z331 Pregnant state, incidental: Secondary | ICD-10-CM | POA: Diagnosis not present

## 2017-12-04 DIAGNOSIS — O99113 Other diseases of the blood and blood-forming organs and certain disorders involving the immune mechanism complicating pregnancy, third trimester: Secondary | ICD-10-CM | POA: Diagnosis not present

## 2017-12-04 DIAGNOSIS — O10919 Unspecified pre-existing hypertension complicating pregnancy, unspecified trimester: Secondary | ICD-10-CM

## 2017-12-04 DIAGNOSIS — Z3483 Encounter for supervision of other normal pregnancy, third trimester: Secondary | ICD-10-CM

## 2017-12-04 DIAGNOSIS — D473 Essential (hemorrhagic) thrombocythemia: Secondary | ICD-10-CM | POA: Diagnosis not present

## 2017-12-04 DIAGNOSIS — O0990 Supervision of high risk pregnancy, unspecified, unspecified trimester: Secondary | ICD-10-CM

## 2017-12-04 DIAGNOSIS — O24415 Gestational diabetes mellitus in pregnancy, controlled by oral hypoglycemic drugs: Secondary | ICD-10-CM | POA: Diagnosis not present

## 2017-12-04 DIAGNOSIS — O98313 Other infections with a predominantly sexual mode of transmission complicating pregnancy, third trimester: Secondary | ICD-10-CM

## 2017-12-04 DIAGNOSIS — O24419 Gestational diabetes mellitus in pregnancy, unspecified control: Secondary | ICD-10-CM

## 2017-12-04 LAB — POCT URINALYSIS DIPSTICK
Blood, UA: NEGATIVE
Glucose, UA: NEGATIVE
Ketones, UA: NEGATIVE
Nitrite, UA: NEGATIVE
Protein, UA: NEGATIVE

## 2017-12-04 NOTE — Progress Notes (Signed)
   [name] name: Tonya Bell MRN [id number]  Date of birth: [birth year] Chief Complaint:   High Risk Gestation (NST/ gbs- gc- chl)  History of Present Illness:   Tonya Bell is a 32 y.o. [phone] female at [redacted]w[redacted]d with an Estimated Date of Delivery: 12/26/17 being seen today for ongoing management of a high-risk pregnancy complicated by chronic HTN, class  A2 DM.  Today she reports no complaints. Contractions: Not present.  .  Movement: Present. denies leaking of fluid.  Review of Systems:   Pertinent items are noted in HPI Denies abnormal vaginal discharge w/ itching/odor/irritation, headaches, visual changes, shortness of breath, chest pain, abdominal pain, severe nausea/vomiting, or problems with urination or bowel movements unless otherwise stated above. Pertinent History Reviewed:  Reviewed past medical,surgical, social, obstetrical and family history.  Reviewed problem list, medications and allergies. Physical Assessment:   Vitals:   12/04/17 0915  BP: 130/70  Pulse: 95  Weight: 295 lb (133.8 kg)  Body mass index is 49.09 kg/m.           Physical Examination:   General appearance: alert, well appearing, and in no distress  Mental status: alert, oriented to person, place, and time  Skin: warm & dry   Extremities: Edema: None    Cardiovascular: normal heart rate noted  Respiratory: normal respiratory effort, no distress  Abdomen: gravid, soft, non-tender  Pelvic: Cervical exam performed         FT/th/post/-3  Fetal Status:     Movement: Present    Fetal Surveillance Testing today: Reactive NST   Results for orders placed or performed in visit on 12/04/17 (from the past 24 hour(s))  POCT urinalysis dipstick   Collection Time: 12/04/17  9:22 AM  Result Value Ref Range   Color, UA     Clarity, UA     Glucose, UA neg    Bilirubin, UA     Ketones, UA neg    Spec Grav, UA  1.010 - 1.025   Blood, UA neg    pH, UA  5.0 - 8.0   Protein,  UA neg    Urobilinogen, UA  0.2 or 1.0 E.U./dL   Nitrite, UA neg    Leukocytes, UA Moderate (2+) (A) Negative   Appearance     Odor      Assessment & Plan:  1) High-risk pregnancy [id number] at [redacted]w[redacted]d with an Estimated Date of Delivery: 12/26/17   2) CHTN, stable, on labetalol   3) Class A2 DM, stable  Meds: No orders of the defined types were placed in this encounter.   Labs/procedures today: Reactive NST  Treatment Plan:  Sonogram 3 days, for EFW and surveillance,   Reviewed: Term labor symptoms and general obstetric precautions including but not limited to vaginal bleeding, contractions, leaking of fluid and fetal movement were reviewed in detail with the patient.  All questions were answered.  Follow-up: Return in about 3 days (around 12/07/2017) for sonogram, HROB.  Orders Placed This Encounter  Procedures  . Strep Gp B NAA  . GC/Chlamydia Probe Amp  . POCT urinalysis dipstick   Florian Buff MD 12/04/2017 10:45 AM

## 2017-12-06 LAB — GC/CHLAMYDIA PROBE AMP
CHLAMYDIA, DNA PROBE: NEGATIVE
NEISSERIA GONORRHOEAE BY PCR: NEGATIVE

## 2017-12-06 LAB — STREP GP B NAA: STREP GROUP B AG: NEGATIVE

## 2017-12-07 ENCOUNTER — Ambulatory Visit (INDEPENDENT_AMBULATORY_CARE_PROVIDER_SITE_OTHER): Payer: Medicaid Other | Admitting: Women's Health

## 2017-12-07 ENCOUNTER — Ambulatory Visit (INDEPENDENT_AMBULATORY_CARE_PROVIDER_SITE_OTHER): Payer: Medicaid Other

## 2017-12-07 ENCOUNTER — Encounter: Payer: Self-pay | Admitting: Women's Health

## 2017-12-07 VITALS — BP 130/66 | HR 90 | Wt 296.0 lb

## 2017-12-07 DIAGNOSIS — Z3A37 37 weeks gestation of pregnancy: Secondary | ICD-10-CM

## 2017-12-07 DIAGNOSIS — O24419 Gestational diabetes mellitus in pregnancy, unspecified control: Secondary | ICD-10-CM

## 2017-12-07 DIAGNOSIS — Z331 Pregnant state, incidental: Secondary | ICD-10-CM

## 2017-12-07 DIAGNOSIS — O10913 Unspecified pre-existing hypertension complicating pregnancy, third trimester: Secondary | ICD-10-CM | POA: Diagnosis not present

## 2017-12-07 DIAGNOSIS — D473 Essential (hemorrhagic) thrombocythemia: Secondary | ICD-10-CM

## 2017-12-07 DIAGNOSIS — O10919 Unspecified pre-existing hypertension complicating pregnancy, unspecified trimester: Secondary | ICD-10-CM

## 2017-12-07 DIAGNOSIS — O24415 Gestational diabetes mellitus in pregnancy, controlled by oral hypoglycemic drugs: Secondary | ICD-10-CM

## 2017-12-07 DIAGNOSIS — O0990 Supervision of high risk pregnancy, unspecified, unspecified trimester: Secondary | ICD-10-CM

## 2017-12-07 DIAGNOSIS — Z1389 Encounter for screening for other disorder: Secondary | ICD-10-CM

## 2017-12-07 DIAGNOSIS — A749 Chlamydial infection, unspecified: Secondary | ICD-10-CM

## 2017-12-07 DIAGNOSIS — D75839 Thrombocytosis, unspecified: Secondary | ICD-10-CM

## 2017-12-07 DIAGNOSIS — O0993 Supervision of high risk pregnancy, unspecified, third trimester: Secondary | ICD-10-CM

## 2017-12-07 LAB — POCT URINALYSIS DIPSTICK
Blood, UA: NEGATIVE
Glucose, UA: NEGATIVE
Ketones, UA: NEGATIVE
NITRITE UA: NEGATIVE
Protein, UA: NEGATIVE

## 2017-12-07 NOTE — Patient Instructions (Signed)
[place], I greatly value your feedback.  If you receive a survey following your visit with [place] today, we appreciate you taking the time to fill it out.  Thanks, Knute Neu, CNM, WHNP-BC   Call the office [phone]) or go to [facility] if:  You begin to have strong, frequent contractions  Your water breaks.  Sometimes it is a big gush of fluid, sometimes it is just a trickle that keeps getting your panties wet or running down your legs  You have vaginal bleeding.  It is normal to have a small amount of spotting if your cervix was checked.   You don't feel your baby moving like normal.  If you don't, get you something to eat and drink and lay down and focus on feeling your baby move.  You should feel at least 10 movements in 2 hours.  If you don't, you should call the office or go to [place] Contractions Contractions of the uterus can occur throughout pregnancy, but they are not always a sign that you are in labor. You may have practice contractions called Braxton Hicks contractions. These false labor contractions are sometimes confused with true labor. What are [place] contractions? Braxton Hicks contractions are tightening movements that occur in the muscles of the uterus before labor. Unlike true labor contractions, these contractions do not result in opening (dilation) and thinning of the cervix. Toward the end of pregnancy (32-34 weeks), Braxton Hicks contractions can happen more often and may become stronger. These contractions are sometimes difficult to tell apart from true labor because they can be very uncomfortable. You should not feel embarrassed if you go to the hospital with false labor. Sometimes, the only way to tell if you are in true labor is for your health care provider to look for changes in the cervix. The health care provider will do a physical exam and may monitor your contractions. If you are not in true labor, the exam should  show that your cervix is not dilating and your water has not broken. If there are other health problems associated with your pregnancy, it is completely safe for you to be sent home with false labor. You may continue to have Braxton Hicks contractions until you go into true labor. How to tell the difference between true labor and false labor True labor  Contractions last 30-70 seconds.  Contractions become very regular.  Discomfort is usually felt in the top of the uterus, and it spreads to the lower abdomen and low back.  Contractions do not go away with walking.  Contractions usually become more intense and increase in frequency.  The cervix dilates and gets thinner. False labor  Contractions are usually shorter and not as strong as true labor contractions.  Contractions are usually irregular.  Contractions are often felt in the front of the lower abdomen and in the groin.  Contractions may go away when you walk around or change positions while lying down.  Contractions get weaker and are shorter-lasting as time goes on.  The cervix usually does not dilate or become thin. Follow these instructions at home:  Take over-the-counter and prescription medicines only as told by your health care provider.  Keep up with your usual exercises and follow other instructions from your health care provider.  Eat and drink lightly if you think you are going into labor.  If Braxton Hicks contractions are making you uncomfortable: ? Change your position from lying  down or resting to walking, or change from walking to resting. ? Sit and rest in a tub of warm water. ? Drink enough fluid to keep your urine pale yellow. Dehydration may cause these contractions. ? Do slow and deep breathing several times an hour.  Keep all follow-up prenatal visits as told by your health care provider. This is important. Contact a health care provider if:  You have a fever.  You have continuous pain in  your abdomen. Get help right away if:  Your contractions become stronger, more regular, and closer together.  You have fluid leaking or gushing from your vagina.  You pass blood-tinged mucus (bloody show).  You have bleeding from your vagina.  You have low back pain that you never had before.  You feel your baby's head pushing down and causing pelvic pressure.  Your baby is not moving inside you as much as it used to. Summary  Contractions that occur before labor are called Braxton Hicks contractions, false labor, or practice contractions.  Braxton Hicks contractions are usually shorter, weaker, farther apart, and less regular than true labor contractions. True labor contractions usually become progressively stronger and regular and they become more frequent.  Manage discomfort from [facility] contractions by changing position, resting in a warm bath, drinking plenty of water, or practicing deep breathing. This information is not intended to replace advice given to you by your health care provider. Make sure you discuss any questions you have with your health care provider. Document Released: 02/16/2017 Document Revised: 02/16/2017 Document Reviewed: 02/16/2017 Elsevier Interactive Patient Education  2018 [employer].

## 2017-12-07 NOTE — Progress Notes (Signed)
   [name] name: Tonya Bell MRN [id number]  Date of birth: 02-12-1986 Chief Complaint:   High Risk Gestation ([place] today)  History of Present Illness:   Tonya Bell is a 32 y.o. [phone] female at [redacted]w[redacted]d with an Estimated Date of Delivery: 12/26/17 being seen today for ongoing management of a high-risk pregnancy complicated by [facility] on Labetalol 100mg  BID, A2DM on metformin 500mg  @ supper, glyburide 2.5mg  q hs.  Today she reports no complaints. All sugars normal except one FBS of 94. Contractions: Not present. Vag. Bleeding: None.  Movement: Present. denies leaking of fluid.  Review of Systems:   Pertinent items are noted in HPI Denies abnormal vaginal discharge w/ itching/odor/irritation, headaches, visual changes, shortness of breath, chest pain, abdominal pain, severe nausea/vomiting, or problems with urination or bowel movements unless otherwise stated above. Pertinent History Reviewed:  Reviewed past medical,surgical, social, obstetrical and family history.  Reviewed problem list, medications and allergies. Physical Assessment:   Vitals:   12/07/17 1025  BP: 130/66  Pulse: 90  Weight: 296 lb (134.3 kg)  Body mass index is 49.26 kg/m.           Physical Examination:   General appearance: alert, well appearing, and in no distress  Mental status: alert, oriented to person, place, and time  Skin: warm & dry   Extremities: Edema: None    Cardiovascular: normal heart rate noted  Respiratory: normal respiratory effort, no distress  Abdomen: gravid, soft, non-tender  Pelvic: Cervical exam deferred         Fetal Status: Fetal Heart Rate (bpm): 152 u/s Fundal Height: 37 cm Movement: Present    Fetal Surveillance Testing today: [place] 23+5 wks,cephalic,BPP 5/7,DUK 025 BPM,posterior pl gr 3,normal ovaries bilat,afi 12 cm,RI .60,.58=58%  Results for orders placed or performed in visit on 12/07/17 (from the past 24 hour(s))  POCT urinalysis dipstick   Collection Time: 12/07/17 10:26 AM  Result Value Ref Range   Color, UA     Clarity, UA     Glucose, UA neg    Bilirubin, UA     Ketones, UA neg    Spec Grav, UA  1.010 - 1.025   Blood, UA neg    pH, UA  5.0 - 8.0   Protein, UA neg    Urobilinogen, UA  0.2 or 1.0 E.U./dL   Nitrite, UA neg    Leukocytes, UA Trace (A) Negative   Appearance     Odor      Assessment & Plan:  1) High-risk pregnancy [id number] at [redacted]w[redacted]d with an Estimated Date of Delivery: 12/26/17   2) CHTN, stable, continue Labetalol 100mg  BID, baby ASA  3) A2DM, stable, continue metformin 500mg  @ supper, glyburide 2.5mg  q hs  Meds: No orders of the defined types were placed in this encounter.   Labs/procedures today: u/s  Treatment Plan:  2x/wk testing nst alt w/ sono, EFW next week, IOL @ 39wks  Reviewed: GBS neg, Term labor symptoms and general obstetric precautions including but not limited to vaginal bleeding, contractions, leaking of fluid and fetal movement were reviewed in detail with the patient.  All questions were answered.  Follow-up: Return for Mon hrob/nst, Thurs hrob, bpp/dopp/efw.  Orders Placed This Encounter  Procedures  . POCT urinalysis dipstick   Roma Schanz CNM, [facility] 12/07/2017 10:50 AM

## 2017-12-07 NOTE — Progress Notes (Signed)
[place] 41+3 wks,cephalic,BPP 6/4,BIP 779 BPM,posterior pl gr 3,normal ovaries bilat,afi 12 cm,RI .60,.58=58%

## 2017-12-11 ENCOUNTER — Encounter: Payer: Self-pay | Admitting: Women's Health

## 2017-12-11 ENCOUNTER — Telehealth (HOSPITAL_COMMUNITY): Payer: Self-pay | Admitting: *Deleted

## 2017-12-11 ENCOUNTER — Ambulatory Visit (INDEPENDENT_AMBULATORY_CARE_PROVIDER_SITE_OTHER): Payer: Medicaid Other | Admitting: Women's Health

## 2017-12-11 VITALS — BP 124/60 | HR 93 | Wt 291.6 lb

## 2017-12-11 DIAGNOSIS — O24415 Gestational diabetes mellitus in pregnancy, controlled by oral hypoglycemic drugs: Secondary | ICD-10-CM | POA: Diagnosis not present

## 2017-12-11 DIAGNOSIS — Z331 Pregnant state, incidental: Secondary | ICD-10-CM | POA: Diagnosis not present

## 2017-12-11 DIAGNOSIS — A749 Chlamydial infection, unspecified: Secondary | ICD-10-CM

## 2017-12-11 DIAGNOSIS — Z3A37 37 weeks gestation of pregnancy: Secondary | ICD-10-CM

## 2017-12-11 DIAGNOSIS — Z1389 Encounter for screening for other disorder: Secondary | ICD-10-CM | POA: Diagnosis not present

## 2017-12-11 DIAGNOSIS — O0990 Supervision of high risk pregnancy, unspecified, unspecified trimester: Secondary | ICD-10-CM

## 2017-12-11 DIAGNOSIS — O24419 Gestational diabetes mellitus in pregnancy, unspecified control: Secondary | ICD-10-CM

## 2017-12-11 DIAGNOSIS — D473 Essential (hemorrhagic) thrombocythemia: Secondary | ICD-10-CM

## 2017-12-11 DIAGNOSIS — O10913 Unspecified pre-existing hypertension complicating pregnancy, third trimester: Secondary | ICD-10-CM | POA: Diagnosis not present

## 2017-12-11 DIAGNOSIS — D75839 Thrombocytosis, unspecified: Secondary | ICD-10-CM

## 2017-12-11 DIAGNOSIS — O0993 Supervision of high risk pregnancy, unspecified, third trimester: Secondary | ICD-10-CM

## 2017-12-11 DIAGNOSIS — O10919 Unspecified pre-existing hypertension complicating pregnancy, unspecified trimester: Secondary | ICD-10-CM

## 2017-12-11 LAB — POCT URINALYSIS DIPSTICK
Blood, UA: NEGATIVE
Glucose, UA: NEGATIVE
KETONES UA: NEGATIVE
Leukocytes, UA: NEGATIVE
NITRITE UA: NEGATIVE

## 2017-12-11 NOTE — Addendum Note (Signed)
Addended by: Wells Guiles R on: 12/11/2017 11:28 AM   Modules accepted: Orders, Level of Service, SmartSet

## 2017-12-11 NOTE — Treatment Plan (Signed)
Induction Assessment Scheduling Form Fax to [facility]:  [phone]  Tonya Bell                                                                                   DOB:  1986/03/17                                                            MRN:  [id number]                                                                     Phone #:   [phone]                         Provider:  [facility]  GP:  [id number]                                                            Estimated Date of Delivery: 12/26/17  Dating Criteria: 7wk u/s    Medical Indications for induction:  CHTN-on meds, A2DM Admission Date/Time:  3/7 @ [place] Gestational age on admission:  54.2   Filed Weights   12/11/17 1007  Weight: 291 lb 9.6 oz (132.3 kg)   HIV:  Non Reactive (12/12 0840) GBS: Negative (02/18 1100)  SVE: not examined   Method of induction(proposed):  cytotec   Scheduling Provider Signature:  Roma Schanz, CNM                                            Today's Date:  12/11/2017

## 2017-12-11 NOTE — Progress Notes (Addendum)
[place] PREGNANCY VISIT Patient name: Tonya Bell MRN [id number]  Date of birth: 03-18-1986 Chief Complaint:   High Risk Gestation (NST)  History of Present Illness:   Tonya Bell is a 32 y.o. [phone] female at [redacted]w[redacted]d with an Estimated Date of Delivery: 12/26/17 being seen today for ongoing management of a high-risk pregnancy complicated by [facility] on Labetalol 100mg  BID, A2DM on metformin 500mg  @ super, glyburide 2.5mg  q hs.  Today she reports fbs 72, 95, 107 (fell asleep, didn't take hs meds), 2hr pp 86, 90, 106, 110. Contractions: Irregular. Vag. Bleeding: None.  Movement: Present. denies leaking of fluid.  Review of Systems:   Pertinent items are noted in HPI Denies abnormal vaginal discharge w/ itching/odor/irritation, headaches, visual changes, shortness of breath, chest pain, abdominal pain, severe nausea/vomiting, or problems with urination or bowel movements unless otherwise stated above. Pertinent History Reviewed:  Reviewed past medical,surgical, social, obstetrical and family history.  Reviewed problem list, medications and allergies. Physical Assessment:   Vitals:   12/11/17 1007  BP: 124/60  Pulse: 93  Weight: 291 lb 9.6 oz (132.3 kg)  Body mass index is 48.52 kg/m.           Physical Examination:   General appearance: alert, well appearing, and in no distress  Mental status: alert, oriented to person, place, and time  Skin: warm & dry   Extremities: Edema: None    Cardiovascular: normal heart rate noted  Respiratory: normal respiratory effort, no distress  Abdomen: gravid, soft, non-tender  Pelvic: Cervical exam deferred         Fetal Status: Fetal Heart Rate (bpm): 145 Fundal Height: 37 cm Movement: Present    Fetal Surveillance Testing today: NST: FHR baseline 145 bpm, Variability: moderate, Accelerations:present, Decelerations:  Absent= Cat 1/Reactive Toco: irregular     Results for orders placed or performed in visit on 12/11/17 (from the past  24 hour(s))  POCT urinalysis dipstick   Collection Time: 12/11/17 10:08 AM  Result Value Ref Range   Color, UA     Clarity, UA     Glucose, UA neg    Bilirubin, UA     Ketones, UA neg    Spec Grav, UA  1.010 - 1.025   Blood, UA neg    pH, UA  5.0 - 8.0   Protein, UA trace    Urobilinogen, UA  0.2 or 1.0 E.U./dL   Nitrite, UA neg    Leukocytes, UA Negative Negative   Appearance     Odor      Assessment & Plan:  1) High-risk pregnancy G4P1021 at [redacted]w[redacted]d with an Estimated Date of Delivery: 12/26/17   2) CHTN, stable, continue Labetalol 100mg  BID  3) A2DM, insufficient monitoring this week, please check sugars QID, remember to take meds, continue metformin 500mg  @ supper, glyburide 2.5mg  q hs  Meds: No orders of the defined types were placed in this encounter.   Labs/procedures today: nst  Treatment Plan:  2x/wk testing, nst alt w/ sono, IOL @ 39wks- scheduled today for 3/7 @ [place] per pt request- has meeting she has to go to 3/6 @ 3pm.  IOL form faxed via Epic and orders placed   Reviewed: Term labor symptoms and general obstetric precautions including but not limited to vaginal bleeding, contractions, leaking of fluid and fetal movement were reviewed in detail with the patient.  All questions were answered.  Follow-up: Return for As scheduled Thurs for hrob, efw/afi/bpp/dopp u/s.  Orders Placed This Encounter  Procedures  . US FETAL BPP WO NON STRESS  . US OB Follow Up  . [place] UA Cord Doppler  . POCT urinalysis dipstick   Roma Schanz CNM, [facility] 12/11/2017 11:12 AM

## 2017-12-11 NOTE — Telephone Encounter (Signed)
Preadmission screen  

## 2017-12-11 NOTE — Patient Instructions (Addendum)
[place], I greatly value your feedback.  If you receive a survey following your visit with [place] today, we appreciate you taking the time to fill it out.  Thanks, Knute Neu, CNM, WHNP-BC  Your induction is scheduled for 3/6 @ 11:45pm. Go to [facility] hospital, Maternity Admissions Unit (Emergency) entrance and let them know you are there to be induced. They will send someone from Labor & Delivery to come get you.     Call the office [phone]) or go to [facility] if:  You begin to have strong, frequent contractions  Your water breaks.  Sometimes it is a big gush of fluid, sometimes it is just a trickle that keeps getting your panties wet or running down your legs  You have vaginal bleeding.  It is normal to have a small amount of spotting if your cervix was checked.   You don't feel your baby moving like normal.  If you don't, get you something to eat and drink and lay down and focus on feeling your baby move.  You should feel at least 10 movements in 2 hours.  If you don't, you should call the office or go to [facility].

## 2017-12-14 ENCOUNTER — Ambulatory Visit (INDEPENDENT_AMBULATORY_CARE_PROVIDER_SITE_OTHER): Payer: Medicaid Other | Admitting: Obstetrics and Gynecology

## 2017-12-14 ENCOUNTER — Ambulatory Visit (INDEPENDENT_AMBULATORY_CARE_PROVIDER_SITE_OTHER): Payer: Medicaid Other

## 2017-12-14 ENCOUNTER — Encounter: Payer: Self-pay | Admitting: Obstetrics and Gynecology

## 2017-12-14 VITALS — BP 124/60 | HR 82 | Wt 291.6 lb

## 2017-12-14 DIAGNOSIS — D473 Essential (hemorrhagic) thrombocythemia: Secondary | ICD-10-CM

## 2017-12-14 DIAGNOSIS — O24419 Gestational diabetes mellitus in pregnancy, unspecified control: Secondary | ICD-10-CM

## 2017-12-14 DIAGNOSIS — Z3A38 38 weeks gestation of pregnancy: Secondary | ICD-10-CM

## 2017-12-14 DIAGNOSIS — O10913 Unspecified pre-existing hypertension complicating pregnancy, third trimester: Secondary | ICD-10-CM | POA: Diagnosis not present

## 2017-12-14 DIAGNOSIS — O0990 Supervision of high risk pregnancy, unspecified, unspecified trimester: Secondary | ICD-10-CM

## 2017-12-14 DIAGNOSIS — Z331 Pregnant state, incidental: Secondary | ICD-10-CM

## 2017-12-14 DIAGNOSIS — O24415 Gestational diabetes mellitus in pregnancy, controlled by oral hypoglycemic drugs: Secondary | ICD-10-CM

## 2017-12-14 DIAGNOSIS — O0993 Supervision of high risk pregnancy, unspecified, third trimester: Secondary | ICD-10-CM

## 2017-12-14 DIAGNOSIS — Z1389 Encounter for screening for other disorder: Secondary | ICD-10-CM

## 2017-12-14 DIAGNOSIS — A749 Chlamydial infection, unspecified: Secondary | ICD-10-CM

## 2017-12-14 DIAGNOSIS — D75839 Thrombocytosis, unspecified: Secondary | ICD-10-CM

## 2017-12-14 DIAGNOSIS — O10919 Unspecified pre-existing hypertension complicating pregnancy, unspecified trimester: Secondary | ICD-10-CM

## 2017-12-14 LAB — POCT URINALYSIS DIPSTICK
GLUCOSE UA: NEGATIVE
Ketones, UA: NEGATIVE
LEUKOCYTES UA: NEGATIVE
Nitrite, UA: NEGATIVE
Protein, UA: NEGATIVE
RBC UA: NEGATIVE

## 2017-12-14 NOTE — Progress Notes (Signed)
[place] 37+0 wks,cephalic,BPP 9/6,KRC 381 BPM,normal ovaries bilat,post pl gr 3,afi 10 cm,RI .56,.58=60%,EFW 3204 g 42%

## 2017-12-14 NOTE — Progress Notes (Signed)
High Risk Pregnancy HROB Diagnosis(es):   Chronic hypertension on labetalol 100 mg twice daily and gestational diabetes A2 on metformin 500 at bedtime and glyburide 2.5 mg at bedtime  G4P1021 [redacted]w[redacted]d Estimated Date of Delivery: 12/26/17    HPI: The patient is being seen today for ongoing management of chtn, A2DM on 2 oral meds. Today she reports good FM Patient reports good fetal movement, denies any bleeding and no rupture of membranes symptoms or regular contractions.   BP weight and urine results reviewed and noted. Blood pressure 124/60, pulse 82, weight 291 lb 9.6 oz (132.3 kg), last menstrual period 03/15/2017.  Fundal Height:  38 Fetal Heart rate:  142 Physical Examination: Abdomen - soft, nontender, nondistended, no masses or organomegaly                                     Pelvic -                                      Edema:  minimal  Urinalysis:NEGATIVE for protein                   Fetal Surveillance Testing today:    [place] 22+2 wks,cephalic,BPP 9/7,LGX 211 BPM,normal ovaries bilat,post pl gr 3,afi 10 cm,RI .56,.58=60%,EFW 3204 g 42%  A copy of this report including all images has been saved and backed up to a second source for retrieval if needed. All measures and details of the anatomical scan, placentation, fluid volume and pelvic anatomy are contained in that report.  Amber Heide Guile 12/14/2017 9:13 AM   Lab and sonogram results have been reviewed. Comments: normal  Assessment:  1.  Pregnancy at [redacted]w[redacted]d,  [id number]   :A2DM.CHTN stable                        2.  Normal fetal size, 42%ile                        3. BPP 8/8,                         4. BCplans: none Medication(s) Plans:  Glyburide 2.5 hs, metformin 500 Q pm   Treatment Plan:  nst on Monday, IOL Thursday.  Follow up in 0.5 weeks for appointment for high risk OB care, NST cervix check

## 2017-12-15 ENCOUNTER — Other Ambulatory Visit: Payer: Self-pay | Admitting: Advanced Practice Midwife

## 2017-12-18 ENCOUNTER — Ambulatory Visit (INDEPENDENT_AMBULATORY_CARE_PROVIDER_SITE_OTHER): Payer: Medicaid Other | Admitting: Women's Health

## 2017-12-18 ENCOUNTER — Encounter: Payer: Self-pay | Admitting: Women's Health

## 2017-12-18 VITALS — BP 132/74 | HR 91 | Wt 292.0 lb

## 2017-12-18 DIAGNOSIS — O24415 Gestational diabetes mellitus in pregnancy, controlled by oral hypoglycemic drugs: Secondary | ICD-10-CM

## 2017-12-18 DIAGNOSIS — O10919 Unspecified pre-existing hypertension complicating pregnancy, unspecified trimester: Secondary | ICD-10-CM

## 2017-12-18 DIAGNOSIS — O0993 Supervision of high risk pregnancy, unspecified, third trimester: Secondary | ICD-10-CM

## 2017-12-18 DIAGNOSIS — Z1389 Encounter for screening for other disorder: Secondary | ICD-10-CM

## 2017-12-18 DIAGNOSIS — O24419 Gestational diabetes mellitus in pregnancy, unspecified control: Secondary | ICD-10-CM

## 2017-12-18 DIAGNOSIS — O10913 Unspecified pre-existing hypertension complicating pregnancy, third trimester: Secondary | ICD-10-CM

## 2017-12-18 DIAGNOSIS — Z3A38 38 weeks gestation of pregnancy: Secondary | ICD-10-CM | POA: Diagnosis not present

## 2017-12-18 DIAGNOSIS — Z331 Pregnant state, incidental: Secondary | ICD-10-CM | POA: Diagnosis not present

## 2017-12-18 DIAGNOSIS — O0990 Supervision of high risk pregnancy, unspecified, unspecified trimester: Secondary | ICD-10-CM

## 2017-12-18 LAB — POCT URINALYSIS DIPSTICK
GLUCOSE UA: NEGATIVE
Ketones, UA: NEGATIVE
LEUKOCYTES UA: NEGATIVE
Nitrite, UA: NEGATIVE
PROTEIN UA: NEGATIVE
RBC UA: NEGATIVE

## 2017-12-18 NOTE — Progress Notes (Signed)
   [name] name: Tonya Bell MRN [id number]  Date of birth: 30-May-1986 Chief Complaint:   High Risk Gestation (NST)  History of Present Illness:   Tonya Bell is a 32 y.o. [phone] female at [redacted]w[redacted]d with an Estimated Date of Delivery: 12/26/17 being seen today for ongoing management of a high-risk pregnancy complicated by [facility] on Labetalol 100mg  BID, A2DM on metformin 500mg  @ supper, glyburide 2.5mg  qhs.  Today she reports no complaints. FBS all normal, except one 96 (forgot to take glyburide night before). 2hr pp all normal.  Contractions: Irregular. Vag. Bleeding: None.  Movement: Present. denies leaking of fluid.  Review of Systems:   Pertinent items are noted in HPI Denies abnormal vaginal discharge w/ itching/odor/irritation, headaches, visual changes, shortness of breath, chest pain, abdominal pain, severe nausea/vomiting, or problems with urination or bowel movements unless otherwise stated above. Pertinent History Reviewed:  Reviewed past medical,surgical, social, obstetrical and family history.  Reviewed problem list, medications and allergies. Physical Assessment:   Vitals:   12/18/17 0914  BP: 132/74  Pulse: 91  Weight: 292 lb (132.5 kg)  Body mass index is 48.59 kg/m.           Physical Examination:   General appearance: alert, well appearing, and in no distress  Mental status: alert, oriented to person, place, and time  Skin: warm & dry   Extremities: Edema: None    Cardiovascular: normal heart rate noted  Respiratory: normal respiratory effort, no distress  Abdomen: gravid, soft, non-tender  Pelvic: Cervical exam performed  Dilation: Fingertip Effacement (%): Thick Station: -3  Fetal Status: Fetal Heart Rate (bpm): 135 Fundal Height: 39 cm Movement: Present Presentation: Vertex  Fetal Surveillance Testing today: NST: FHR baseline 135 bpm, Variability: moderate, Accelerations:present, Decelerations:  Absent= Cat 1/Reactive Toco: 1  uc     Results for orders placed or performed in visit on 12/18/17 (from the past 24 hour(s))  POCT urinalysis dipstick   Collection Time: 12/18/17  9:17 AM  Result Value Ref Range   Color, UA     Clarity, UA     Glucose, UA neg    Bilirubin, UA     Ketones, UA neg    Spec Grav, UA  1.010 - 1.025   Blood, UA neg    pH, UA  5.0 - 8.0   Protein, UA neg    Urobilinogen, UA  0.2 or 1.0 E.U./dL   Nitrite, UA neg    Leukocytes, UA Negative Negative   Appearance     Odor      Assessment & Plan:  1) High-risk pregnancy [id number] at [redacted]w[redacted]d with an Estimated Date of Delivery: 12/26/17   2) CHTN, stable, on Labetalol 100mg  BID  3) A2DM, stable, on metformin 500mg  w/ supper, glyburide 2.5mg  qhs  Meds: No orders of the defined types were placed in this encounter.   Labs/procedures today: nst, sve  Treatment Plan:  Has IOL scheduled for 3/7 @ [facility]  Reviewed: Term labor symptoms and general obstetric precautions including but not limited to vaginal bleeding, contractions, leaking of fluid and fetal movement were reviewed in detail with the patient.  All questions were answered.  Follow-up: Return for will need bp check 1wk after delivery.  Orders Placed This Encounter  Procedures  . POCT urinalysis dipstick   Roma Schanz CNM, [facility] 12/18/2017 10:11 AM

## 2017-12-18 NOTE — Patient Instructions (Signed)
[place], I greatly value your feedback.  If you receive a survey following your visit with [place] today, we appreciate you taking the time to fill it out.  Thanks, Knute Neu, CNM, WHNP-BC   Call the office [phone]) or go to [facility] if:  You begin to have strong, frequent contractions  Your water breaks.  Sometimes it is a big gush of fluid, sometimes it is just a trickle that keeps getting your panties wet or running down your legs  You have vaginal bleeding.  It is normal to have a small amount of spotting if your cervix was checked.   You don't feel your baby moving like normal.  If you don't, get you something to eat and drink and lay down and focus on feeling your baby move.  You should feel at least 10 movements in 2 hours.  If you don't, you should call the office or go to [facility].    Call the office [phone]) or go to [facility] hospital for these signs of pre-eclampsia:  Severe headache that does not go away with Tylenol  Visual changes- seeing spots, double, blurred vision  Pain under your right breast or upper abdomen that does not go away with Tums or heartburn medicine  Nausea and/or vomiting  Severe swelling in your hands, feet, and face      Braxton Hicks Contractions Contractions of the uterus can occur throughout pregnancy, but they are not always a sign that you are in labor. You may have practice contractions called Braxton Hicks contractions. These false labor contractions are sometimes confused with true labor. What are [place] contractions? Braxton Hicks contractions are tightening movements that occur in the muscles of the uterus before labor. Unlike true labor contractions, these contractions do not result in opening (dilation) and thinning of the cervix. Toward the end of pregnancy (32-34 weeks), Braxton Hicks contractions can happen more often and may become stronger. These contractions are sometimes difficult to tell apart from  true labor because they can be very uncomfortable. You should not feel embarrassed if you go to the hospital with false labor. Sometimes, the only way to tell if you are in true labor is for your health care provider to look for changes in the cervix. The health care provider will do a physical exam and may monitor your contractions. If you are not in true labor, the exam should show that your cervix is not dilating and your water has not broken. If there are other health problems associated with your pregnancy, it is completely safe for you to be sent home with false labor. You may continue to have Braxton Hicks contractions until you go into true labor. How to tell the difference between true labor and false labor True labor  Contractions last 30-70 seconds.  Contractions become very regular.  Discomfort is usually felt in the top of the uterus, and it spreads to the lower abdomen and low back.  Contractions do not go away with walking.  Contractions usually become more intense and increase in frequency.  The cervix dilates and gets thinner. False labor  Contractions are usually shorter and not as strong as true labor contractions.  Contractions are usually irregular.  Contractions are often felt in the front of the lower abdomen and in the groin.  Contractions may go away when you walk around or change positions while lying down.  Contractions get weaker and are shorter-lasting as time goes on.  The cervix usually does  not dilate or become thin. Follow these instructions at home:  Take over-the-counter and prescription medicines only as told by your health care provider.  Keep up with your usual exercises and follow other instructions from your health care provider.  Eat and drink lightly if you think you are going into labor.  If Braxton Hicks contractions are making you uncomfortable: ? Change your position from lying down or resting to walking, or change from walking to  resting. ? Sit and rest in a tub of warm water. ? Drink enough fluid to keep your urine pale yellow. Dehydration may cause these contractions. ? Do slow and deep breathing several times an hour.  Keep all follow-up prenatal visits as told by your health care provider. This is important. Contact a health care provider if:  You have a fever.  You have continuous pain in your abdomen. Get help right away if:  Your contractions become stronger, more regular, and closer together.  You have fluid leaking or gushing from your vagina.  You pass blood-tinged mucus (bloody show).  You have bleeding from your vagina.  You have low back pain that you never had before.  You feel your baby's head pushing down and causing pelvic pressure.  Your baby is not moving inside you as much as it used to. Summary  Contractions that occur before labor are called Braxton Hicks contractions, false labor, or practice contractions.  Braxton Hicks contractions are usually shorter, weaker, farther apart, and less regular than true labor contractions. True labor contractions usually become progressively stronger and regular and they become more frequent.  Manage discomfort from [facility] contractions by changing position, resting in a warm bath, drinking plenty of water, or practicing deep breathing. This information is not intended to replace advice given to you by your health care provider. Make sure you discuss any questions you have with your health care provider. Document Released: 02/16/2017 Document Revised: 02/16/2017 Document Reviewed: 02/16/2017 Elsevier Interactive Patient Education  2018 [employer].

## 2017-12-21 ENCOUNTER — Encounter (HOSPITAL_COMMUNITY): Payer: Self-pay

## 2017-12-21 ENCOUNTER — Inpatient Hospital Stay (HOSPITAL_COMMUNITY): Payer: Medicaid Other | Admitting: Anesthesiology

## 2017-12-21 ENCOUNTER — Inpatient Hospital Stay (HOSPITAL_COMMUNITY)
Admission: RE | Admit: 2017-12-21 | Discharge: 2017-12-23 | DRG: 806 | Disposition: A | Discharge status: Home / Self Care | Payer: Medicaid Other | Source: Ambulatory Visit | Attending: Obstetrics & Gynecology | Admitting: Obstetrics & Gynecology

## 2017-12-21 DIAGNOSIS — O24425 Gestational diabetes mellitus in childbirth, controlled by oral hypoglycemic drugs: Secondary | ICD-10-CM | POA: Diagnosis present

## 2017-12-21 DIAGNOSIS — O0990 Supervision of high risk pregnancy, unspecified, unspecified trimester: Secondary | ICD-10-CM

## 2017-12-21 DIAGNOSIS — O99324 Drug use complicating childbirth: Secondary | ICD-10-CM | POA: Diagnosis present

## 2017-12-21 DIAGNOSIS — D473 Essential (hemorrhagic) thrombocythemia: Secondary | ICD-10-CM | POA: Diagnosis present

## 2017-12-21 DIAGNOSIS — O10919 Unspecified pre-existing hypertension complicating pregnancy, unspecified trimester: Secondary | ICD-10-CM | POA: Diagnosis present

## 2017-12-21 DIAGNOSIS — D75839 Thrombocytosis, unspecified: Secondary | ICD-10-CM

## 2017-12-21 DIAGNOSIS — F17200 Nicotine dependence, unspecified, uncomplicated: Secondary | ICD-10-CM | POA: Diagnosis present

## 2017-12-21 DIAGNOSIS — Z87891 Personal history of nicotine dependence: Secondary | ICD-10-CM | POA: Diagnosis not present

## 2017-12-21 DIAGNOSIS — D649 Anemia, unspecified: Secondary | ICD-10-CM | POA: Diagnosis present

## 2017-12-21 DIAGNOSIS — Z7982 Long term (current) use of aspirin: Secondary | ICD-10-CM | POA: Diagnosis not present

## 2017-12-21 DIAGNOSIS — Z3A39 39 weeks gestation of pregnancy: Secondary | ICD-10-CM | POA: Diagnosis not present

## 2017-12-21 DIAGNOSIS — A749 Chlamydial infection, unspecified: Secondary | ICD-10-CM

## 2017-12-21 DIAGNOSIS — O24419 Gestational diabetes mellitus in pregnancy, unspecified control: Secondary | ICD-10-CM

## 2017-12-21 DIAGNOSIS — O1002 Pre-existing essential hypertension complicating childbirth: Principal | ICD-10-CM | POA: Diagnosis present

## 2017-12-21 DIAGNOSIS — F1290 Cannabis use, unspecified, uncomplicated: Secondary | ICD-10-CM | POA: Diagnosis present

## 2017-12-21 DIAGNOSIS — O2412 Pre-existing diabetes mellitus, type 2, in childbirth: Secondary | ICD-10-CM | POA: Diagnosis not present

## 2017-12-21 DIAGNOSIS — O9902 Anemia complicating childbirth: Secondary | ICD-10-CM | POA: Diagnosis present

## 2017-12-21 DIAGNOSIS — O1092 Unspecified pre-existing hypertension complicating childbirth: Secondary | ICD-10-CM | POA: Diagnosis not present

## 2017-12-21 LAB — CBC
HEMATOCRIT: 31.3 % — AB (ref 36.0–46.0)
HEMATOCRIT: 31.1 % — AB (ref 36.0–46.0)
HEMOGLOBIN: 10 g/dL — AB (ref 12.0–15.0)
Hemoglobin: 10.2 g/dL — ABNORMAL LOW (ref 12.0–15.0)
MCH: 27.3 pg (ref 26.0–34.0)
MCH: 27.5 pg (ref 26.0–34.0)
MCHC: 31.9 g/dL (ref 30.0–36.0)
MCHC: 32.8 g/dL (ref 30.0–36.0)
MCV: 85.5 fL (ref 78.0–100.0)
MCV: 83.8 fL (ref 78.0–100.0)
PLATELETS: 574 10*3/uL — AB (ref 150–400)
Platelets: 620 10*3/uL — ABNORMAL HIGH (ref 150–400)
RBC: 3.66 MIL/uL — AB (ref 3.87–5.11)
RBC: 3.71 MIL/uL — ABNORMAL LOW (ref 3.87–5.11)
RDW: 15.5 % (ref 11.5–15.5)
RDW: 15.6 % — AB (ref 11.5–15.5)
WBC: 13.4 10*3/uL — ABNORMAL HIGH (ref 4.0–10.5)
WBC: 11 10*3/uL — ABNORMAL HIGH (ref 4.0–10.5)

## 2017-12-21 LAB — GLUCOSE, CAPILLARY
GLUCOSE-CAPILLARY: 109 mg/dL — AB (ref 65–99)
Glucose-Capillary: 89 mg/dL (ref 65–99)
Glucose-Capillary: 97 mg/dL (ref 65–99)
Glucose-Capillary: 73 mg/dL (ref 65–99)
Glucose-Capillary: 76 mg/dL (ref 65–99)

## 2017-12-21 LAB — TYPE AND SCREEN
ABO/RH(D): B POS
Antibody Screen: NEGATIVE

## 2017-12-21 LAB — RPR: RPR Ser Ql: NONREACTIVE

## 2017-12-21 LAB — ABO/RH: ABO/RH(D): B POS

## 2017-12-21 MED ORDER — MEASLES, MUMPS & RUBELLA VAC ~~LOC~~ INJ: 0.5000 mL | INJECTION | Freq: Once | SUBCUTANEOUS | Status: DC

## 2017-12-21 MED ORDER — MISOPROSTOL 25 MCG QUARTER TABLET: 25.0000 ug | ORAL_TABLET | ORAL | Status: DC | PRN

## 2017-12-21 MED ORDER — LIDOCAINE HCL (PF) 1 % IJ SOLN
INTRAMUSCULAR | Status: DC | PRN
  Administered 2017-12-21: 8 mL via EPIDURAL
  Administered 2017-12-21: 4 mL via EPIDURAL

## 2017-12-21 MED ORDER — ACETAMINOPHEN 325 MG PO TABS: 650.0000 mg | ORAL_TABLET | ORAL | Status: DC | PRN

## 2017-12-21 MED ORDER — COCONUT OIL OIL: 1.0000 "application " | TOPICAL_OIL | Status: DC | PRN

## 2017-12-21 MED ORDER — WITCH HAZEL-GLYCERIN EX PADS: 1.0000 "application " | MEDICATED_PAD | CUTANEOUS | Status: DC | PRN

## 2017-12-21 MED ORDER — SIMETHICONE 80 MG PO CHEW: 80.0000 mg | CHEWABLE_TABLET | ORAL | Status: DC | PRN

## 2017-12-21 MED ORDER — EPHEDRINE 5 MG/ML INJ
10.0000 mg | INTRAVENOUS | Status: DC | PRN
  Filled 2017-12-21: qty 2

## 2017-12-21 MED ORDER — ONDANSETRON HCL 4 MG/2ML IJ SOLN: 4.0000 mg | INTRAMUSCULAR | Status: DC | PRN

## 2017-12-21 MED ORDER — OXYTOCIN 40 UNITS IN LACTATED RINGERS INFUSION - SIMPLE MED
1.0000 m[IU]/min | INTRAVENOUS | Status: DC
  Administered 2017-12-21: 2 m[IU]/min via INTRAVENOUS
  Filled 2017-12-21: qty 1000

## 2017-12-21 MED ORDER — PHENYLEPHRINE 40 MCG/ML (10ML) SYRINGE FOR IV PUSH (FOR BLOOD PRESSURE SUPPORT)
80.0000 ug | PREFILLED_SYRINGE | INTRAVENOUS | Status: DC | PRN
  Filled 2017-12-21: qty 5

## 2017-12-21 MED ORDER — DIBUCAINE 1 % RE OINT: 1.0000 "application " | TOPICAL_OINTMENT | RECTAL | Status: DC | PRN

## 2017-12-21 MED ORDER — DIPHENHYDRAMINE HCL 25 MG PO CAPS: 25.0000 mg | ORAL_CAPSULE | Freq: Four times a day (QID) | ORAL | Status: DC | PRN

## 2017-12-21 MED ORDER — IBUPROFEN 600 MG PO TABS
600.0000 mg | ORAL_TABLET | Freq: Four times a day (QID) | ORAL | Status: DC
  Administered 2017-12-21 – 2017-12-23 (×7): 600 mg via ORAL
  Filled 2017-12-21 (×6): qty 1

## 2017-12-21 MED ORDER — ONDANSETRON HCL 4 MG/2ML IJ SOLN: 4.0000 mg | Freq: Four times a day (QID) | INTRAMUSCULAR | Status: DC | PRN

## 2017-12-21 MED ORDER — LACTATED RINGERS IV SOLN: 500.0000 mL | INTRAVENOUS | Status: DC | PRN

## 2017-12-21 MED ORDER — LABETALOL HCL 200 MG PO TABS
200.0000 mg | ORAL_TABLET | Freq: Two times a day (BID) | ORAL | Status: DC
  Filled 2017-12-21: qty 1

## 2017-12-21 MED ORDER — LACTATED RINGERS IV SOLN
INTRAVENOUS | Status: DC
  Administered 2017-12-21: 17:00:00 via INTRAUTERINE

## 2017-12-21 MED ORDER — OXYTOCIN BOLUS FROM INFUSION
500.0000 mL | Freq: Once | INTRAVENOUS | Status: AC
  Administered 2017-12-21: 500 mL via INTRAVENOUS

## 2017-12-21 MED ORDER — METHYLERGONOVINE MALEATE 0.2 MG PO TABS: 0.2000 mg | ORAL_TABLET | ORAL | Status: DC | PRN

## 2017-12-21 MED ORDER — TERBUTALINE SULFATE 1 MG/ML IJ SOLN
0.2500 mg | Freq: Once | INTRAMUSCULAR | Status: DC | PRN
  Filled 2017-12-21: qty 1

## 2017-12-21 MED ORDER — PRENATAL MULTIVITAMIN CH
1.0000 | ORAL_TABLET | Freq: Every day | ORAL | Status: DC
  Administered 2017-12-22 – 2017-12-23 (×2): 1 via ORAL
  Filled 2017-12-21 (×2): qty 1

## 2017-12-21 MED ORDER — FERROUS SULFATE 325 (65 FE) MG PO TABS
325.0000 mg | ORAL_TABLET | Freq: Two times a day (BID) | ORAL | Status: DC
  Administered 2017-12-22 – 2017-12-23 (×3): 325 mg via ORAL
  Filled 2017-12-21 (×3): qty 1

## 2017-12-21 MED ORDER — OXYCODONE-ACETAMINOPHEN 5-325 MG PO TABS: 1.0000 | ORAL_TABLET | ORAL | Status: DC | PRN

## 2017-12-21 MED ORDER — METHYLERGONOVINE MALEATE 0.2 MG/ML IJ SOLN: 0.2000 mg | INTRAMUSCULAR | Status: DC | PRN

## 2017-12-21 MED ORDER — OXYTOCIN 40 UNITS IN LACTATED RINGERS INFUSION - SIMPLE MED
2.5000 [IU]/h | INTRAVENOUS | Status: DC
  Administered 2017-12-21: 2.5 [IU]/h via INTRAVENOUS

## 2017-12-21 MED ORDER — FLEET ENEMA 7-19 GM/118ML RE ENEM: 1.0000 | ENEMA | Freq: Every day | RECTAL | Status: DC | PRN

## 2017-12-21 MED ORDER — PHENYLEPHRINE 40 MCG/ML (10ML) SYRINGE FOR IV PUSH (FOR BLOOD PRESSURE SUPPORT)
80.0000 ug | PREFILLED_SYRINGE | INTRAVENOUS | Status: DC | PRN
  Filled 2017-12-21: qty 10
  Filled 2017-12-21: qty 5

## 2017-12-21 MED ORDER — SOD CITRATE-CITRIC ACID 500-334 MG/5ML PO SOLN: 30.0000 mL | ORAL | Status: DC | PRN

## 2017-12-21 MED ORDER — HYDROXYZINE HCL 50 MG PO TABS
50.0000 mg | ORAL_TABLET | Freq: Four times a day (QID) | ORAL | Status: DC | PRN
  Filled 2017-12-21: qty 1

## 2017-12-21 MED ORDER — LIDOCAINE HCL (PF) 1 % IJ SOLN
30.0000 mL | INTRAMUSCULAR | Status: DC | PRN
  Filled 2017-12-21: qty 30

## 2017-12-21 MED ORDER — ZOLPIDEM TARTRATE 5 MG PO TABS: 5.0000 mg | ORAL_TABLET | Freq: Every evening | ORAL | Status: DC | PRN

## 2017-12-21 MED ORDER — TETANUS-DIPHTH-ACELL PERTUSSIS 5-2.5-18.5 LF-MCG/0.5 IM SUSP
0.5000 mL | Freq: Once | INTRAMUSCULAR | Status: AC
  Administered 2017-12-22: 0.5 mL via INTRAMUSCULAR

## 2017-12-21 MED ORDER — LACTATED RINGERS IV SOLN
INTRAVENOUS | Status: DC
  Administered 2017-12-21 (×2): via INTRAVENOUS

## 2017-12-21 MED ORDER — OXYCODONE-ACETAMINOPHEN 5-325 MG PO TABS: 2.0000 | ORAL_TABLET | ORAL | Status: DC | PRN

## 2017-12-21 MED ORDER — FLEET ENEMA 7-19 GM/118ML RE ENEM: 1.0000 | ENEMA | RECTAL | Status: DC | PRN

## 2017-12-21 MED ORDER — BISACODYL 10 MG RE SUPP: 10.0000 mg | Freq: Every day | RECTAL | Status: DC | PRN

## 2017-12-21 MED ORDER — DIPHENHYDRAMINE HCL 50 MG/ML IJ SOLN: 12.5000 mg | INTRAMUSCULAR | Status: DC | PRN

## 2017-12-21 MED ORDER — ONDANSETRON HCL 4 MG PO TABS: 4.0000 mg | ORAL_TABLET | ORAL | Status: DC | PRN

## 2017-12-21 MED ORDER — FENTANYL 2.5 MCG/ML BUPIVACAINE 1/10 % EPIDURAL INFUSION (WH - ANES)
14.0000 mL/h | INTRAMUSCULAR | Status: DC | PRN
  Administered 2017-12-21 (×2): 14 mL/h via EPIDURAL
  Filled 2017-12-21 (×2): qty 100

## 2017-12-21 MED ORDER — BENZOCAINE-MENTHOL 20-0.5 % EX AERO
1.0000 "application " | INHALATION_SPRAY | CUTANEOUS | Status: DC | PRN
  Administered 2017-12-21: 1 via TOPICAL
  Filled 2017-12-21: qty 56

## 2017-12-21 MED ORDER — MISOPROSTOL 50MCG HALF TABLET
50.0000 ug | ORAL_TABLET | ORAL | Status: DC | PRN
  Administered 2017-12-21 (×2): 50 ug via BUCCAL
  Filled 2017-12-21 (×3): qty 1

## 2017-12-21 MED ORDER — FENTANYL CITRATE (PF) 100 MCG/2ML IJ SOLN: 50.0000 ug | INTRAMUSCULAR | Status: DC | PRN

## 2017-12-21 MED ORDER — DOCUSATE SODIUM 100 MG PO CAPS
100.0000 mg | ORAL_CAPSULE | Freq: Two times a day (BID) | ORAL | Status: DC
  Administered 2017-12-21 – 2017-12-23 (×3): 100 mg via ORAL
  Filled 2017-12-21 (×3): qty 1

## 2017-12-21 MED ORDER — OXYCODONE HCL 5 MG PO TABS: 5.0000 mg | ORAL_TABLET | ORAL | Status: DC | PRN

## 2017-12-21 MED ORDER — OXYCODONE HCL 5 MG PO TABS: 10.0000 mg | ORAL_TABLET | ORAL | Status: DC | PRN

## 2017-12-21 MED ORDER — LACTATED RINGERS IV SOLN
500.0000 mL | Freq: Once | INTRAVENOUS | Status: AC
  Administered 2017-12-21: 500 mL via INTRAVENOUS

## 2017-12-21 NOTE — Anesthesia Preprocedure Evaluation (Signed)
Anesthesia Evaluation  Patient identified by MRN, date of birth, ID band Patient awake    Reviewed: Allergy & Precautions, NPO status , Patient's Chart, lab work & pertinent test results  Airway Mallampati: II  TM Distance: >3 FB Neck ROM: Full    Dental  (+) Dental Advisory Given   Pulmonary former smoker,    Pulmonary exam normal breath sounds clear to auscultation       Cardiovascular hypertension, Pt. on home beta blockers and Pt. on medications Normal cardiovascular exam Rhythm:Regular Rate:Normal     Neuro/Psych negative neurological ROS  negative psych ROS   GI/Hepatic negative GI ROS, (+)     substance abuse  marijuana use,   Endo/Other  diabetes, Type 2, Oral Hypoglycemic AgentsMorbid obesity  Renal/GU negative Renal ROS  negative genitourinary   Musculoskeletal negative musculoskeletal ROS (+)   Abdominal (+) + obese,   Peds  Hematology  (+) anemia ,   Anesthesia Other Findings   Reproductive/Obstetrics (+) Pregnancy PCOS                             Anesthesia Physical Anesthesia Plan  ASA: III  Anesthesia Plan: Epidural   Post-op Pain Management:    Induction:   PONV Risk Score and Plan:   Airway Management Planned: Natural Airway  Additional Equipment:   Intra-op Plan:   Post-operative Plan:   Informed Consent: I have reviewed the patients History and Physical, chart, labs and discussed the procedure including the risks, benefits and alternatives for the proposed anesthesia with the patient or authorized representative who has indicated his/her understanding and acceptance.     Plan Discussed with:   Anesthesia Plan Comments: (Labs reviewed. Platelets acceptable, patient not taking any blood thinning medications. Risks and benefits discussed with patient, patient expressed understanding and wished to proceed.)        Anesthesia Quick Evaluation

## 2017-12-21 NOTE — Progress Notes (Signed)
Labor Progress Note KATHELEEN STELLA is a 32 y.o. [phone] at [redacted]w[redacted]d presented for IOL for gHTN and A2GDM.  S: Patient more comfortable after epidural.  O:  BP 126/69   Pulse 71   Temp 98.6 F (37 C) (Oral)   Resp 18   Ht 5\' 5"  (1.651 m)   Wt 286 lb 6.4 oz (129.9 kg)   LMP 03/15/2017   BMI 47.66 kg/m   BXU:XYBFXOVA rate 140, moderate variability, no acels, few early decels Toco: ctx every 5 min, irregular  CVE: Dilation: 4.5 Effacement (%): 50 Cervical Position: Posterior Station: -2 Presentation: Vertex Exam by:: S Nix RN  A&P: 32 y.o. [id number] [redacted]w[redacted]d here for IOL for gHTN. #Labor: Pitocin started after epidural placement. Continue titration as needed. #Pain: Epidural #FWB: Cat I #GBS negative #gHTN: normal ranges, asymptomatic. #A2GDM: CBGs wnl. Continue to monitor.  Elo Marmolejos, DO 1:32 PM

## 2017-12-21 NOTE — Progress Notes (Signed)
Labor Progress Note  Tonya Bell is a 32 y.o. [phone] at [redacted]w[redacted]d  admitted for induction of labor due to Gestational diabetes and Hypertension.  S: Doing well. Resting. Feeling contractions. Denies PIH symptoms (HA, scotomata, RUQ pain).  O:  BP (!) 118/49   Pulse 81   Temp 98.3 F (36.8 C) (Oral)   Resp 18   Ht 5\' 5"  (1.651 m)   Wt 129.9 kg (286 lb 6.4 oz)   LMP 03/15/2017   BMI 47.66 kg/m   No intake/output data recorded.  FHT:  FHR: 130 bpm, variability: moderate,  accelerations:  Present,  decelerations:  Absent UC:   occasional SVE:   Dilation: 1 Effacement (%): 50 Station: -2 Exam by:: Juliane Lack, RN  Labs: Lab Results  Component Value Date   WBC 13.4 (H) 12/21/2017   HGB 10.0 (L) 12/21/2017   HCT 31.3 (L) 12/21/2017   MCV 85.5 12/21/2017   PLT 620 (H) 12/21/2017    Assessment / Plan: 32 y.o. [id number] [redacted]w[redacted]d. Not in labor Induction of labor due to gestational diabetes and cHTN  Labor: Progressing normally, continue induction with cytotec. FB placed in addition. CHTN: BPs wnl. No signs of preeclampsia.  GDM: CBGs controlled.  Fetal Wellbeing:  Category I Pain Control:  Labor support without medications Anticipated MOD:  NSVD  Expectant management   Luiz Blare, DO OB Fellow Center for [facility], [facility]

## 2017-12-21 NOTE — Anesthesia Pain Management Evaluation Note (Signed)
  CRNA Pain Management Visit Note  Patient: Tonya Bell, 32 y.o., female  "Hello I am a member of the anesthesia team at [facility]. We have an anesthesia team available at all times to provide care throughout the hospital, including epidural management and anesthesia for C-section. I don't know your plan for the delivery whether it a natural birth, water birth, IV sedation, nitrous supplementation, doula or epidural, but we want to meet your pain goals."   1.Was your pain managed to your expectations on prior hospitalizations?   Yes   2.What is your expectation for pain management during this hospitalization?     Epidural  3.How can we help you reach that goal?   Record the patient's initial score and the patient's pain goal.   Pain: 6  Pain Goal: 3 [facility] wants you to be able to say your pain was always managed very well.  Jabier Mutton 12/21/2017

## 2017-12-21 NOTE — Anesthesia Procedure Notes (Signed)
Epidural Patient location during procedure: OB Start time: 12/21/2017 10:46 AM End time: 12/21/2017 11:09 AM  Staffing Anesthesiologist: Audry Pili, MD Performed: anesthesiologist   Preanesthetic Checklist Completed: patient identified, pre-op evaluation, timeout performed, IV checked, risks and benefits discussed and monitors and equipment checked  Epidural Patient position: sitting Prep: DuraPrep Patient monitoring: continuous pulse ox and blood pressure Approach: midline Location: L4-L5 Injection technique: LOR saline  Needle:  Needle type: Tuohy  Needle gauge: 17 G Needle length: 15 cm Needle insertion depth: 10 cm Catheter size: 19 Gauge Catheter at skin depth: 16 cm Test dose: negative and Other (1% lidocaine)  Additional Notes Patient identified. Risks including, but not limited to, bleeding, infection, nerve damage, paralysis, inadequate analgesia, blood pressure changes, nausea, vomiting, allergic reaction, postpartum back pain, itching, and headache were discussed. Patient expressed understanding and wished to proceed. Sterile prep and drape, including hand hygiene, mask, and sterile gloves were used. The patient was positioned and the spine was prepped. The skin was anesthetized with lidocaine. Multiple attempts made, patient with high BMI and poor positioning. One failed attempt right paramedian, 3 attempts midline before successful placement. No paraesthesia or other complication noted. The patient did not experience any signs of intravascular injection such as tinnitus or metallic taste in mouth, nor signs of intrathecal spread such as rapid motor block. Please see nursing notes for vital signs. The patient tolerated the procedure well.   Renold Don, MDReason for block:procedure for pain

## 2017-12-21 NOTE — Progress Notes (Signed)
Labor Progress Note Tonya Bell is a 32 y.o. [phone] at [redacted]w[redacted]d presented for IOL for gHTN and A2GDM. S: No complaints  O:  BP 126/60   Pulse 73   Temp 98.6 F (37 C) (Oral)   Resp 18   Ht 5\' 5"  (1.651 m)   Wt 129.9 kg (286 lb 6.4 oz)   LMP 03/15/2017   BMI 47.66 kg/m  EFM: 130 bpm/mod var/no decels  CVE: Dilation: 9 Effacement (%): 90 Cervical Position: Posterior Station: Plus 1 Presentation: Vertex Exam by:: Felipa Laroche   A&P: 32 y.o. [id number] [redacted]w[redacted]d here for IOL for gHTN and A1GDM #Labor: Progressing well. Anticipate SVD. #Pain: epidural #FWB: cat 1   [name], DO 7:03 PM

## 2017-12-21 NOTE — Progress Notes (Signed)
Labor Progress Note Tonya Bell is a 32 y.o. [phone] at [redacted]w[redacted]d presented for IOL for gHTN and A2GDM. S: Patient is feeling more uncomfortable  O:  BP 138/71   Pulse 78   Temp 98.3 F (36.8 C) (Oral)   Resp 18   Ht 5\' 5"  (1.651 m)   Wt 129.9 kg (286 lb 6.4 oz)   LMP 03/15/2017   BMI 47.66 kg/m  EFM: 140 bpm/mod var/no decels/pos acels  CVE: Dilation: 4.5 Effacement (%): 80 Cervical Position: Posterior Station: -1 Presentation: Vertex Exam by:: Juliane Lack, RN   A&P: 32 y.o. [phone] [redacted]w[redacted]d here for IOl for gHTN and A2GDM #Labor: FB out. Start pitocin now. #Pain: will get epidural #FWB: cat 1 #gHTN: blood pressure in normal range. Asymptomatic. #A2GDM: blood sugars in normal range  [employer], DO 9:36 AM

## 2017-12-21 NOTE — Progress Notes (Signed)
Labor Progress Note Tonya Bell is a 32 y.o. [phone] at [redacted]w[redacted]d presented for IOL for gHTN and A2GDM.  S: Patient comfortable.  O:  BP (!) 115/44   Pulse 75   Temp 98.6 F (37 C) (Oral)   Resp 18   Ht 5\' 5"  (1.651 m)   Wt 286 lb 6.4 oz (129.9 kg)   LMP 03/15/2017   BMI 47.66 kg/m   XBD:ZHGDJMEQ rate 145, moderate variability, few acels, some variable decels Toco: ctx every 3 min, irregular  CVE: Dilation: 6.5 Effacement (%): 80 Cervical Position: Posterior Station: -2 Presentation: Vertex Exam by:: Moss  A&P: 32 y.o. [id number] [redacted]w[redacted]d here for IOL for gHTN and A2GDM. #Labor: Progressing well. IUPC placed. Start Amnioinfusion. #Pain: Epidural #FWB: Cat II #gHTN: BP wnl. Asymptomatic, continue to monitor. #A2GDM: CBGs wnl. Continue to monitor.  Rory Percy, DO 4:37 PM

## 2017-12-21 NOTE — H&P (Signed)
Obstetric History and Physical  JEANY SEVILLE is a 32 y.o. [phone] with IUP at [redacted]w[redacted]d presenting for IOL for cHTN and A2GDM. Patient has high risk pregnnacy complicated by [facility] for which she is on Labetalol 200mg  BID. Also has A2GDM for which she is on metformin 500 mg and glyburide 2.5mg  qhs. Patient states CBGs have been well controlled as well as her BPs.   Patient states she has been having  irregular contractions, none vaginal bleeding, intact membranes, with active fetal movement.  No blurry vision, headaches or peripheral edema, and RUQ pain.   Prenatal Course Source of Care: FT with onset of care at 9 weeks Dating: By early [place] --->  Estimated Date of Delivery: 4/31/54 Pregnancy complications or risks: Patient Active Problem List   Diagnosis Date Noted  . Gestational diabetes mellitus, class A2 09/29/2017  . Chronic hypertension during pregnancy, antepartum 07/12/2017  . Thrombocytosis ([place]) 05/25/2017  . Chlamydia 05/25/2017  . Supervision of high risk pregnancy, antepartum 05/24/2017  . Smoker 05/24/2017  . Marijuana use 05/24/2017   She plans to breastfeed She desires condoms for postpartum contraception.   Sono:    @[redacted]w[redacted]d , CWD, normal anatomy, cephalic presentation, posterior placenta, 3204g, 42% EFW, AFI normal, BPP 8/8  Prenatal labs and studies: ABO, Rh: B/Positive/-- (08/08 1232) Antibody: Negative (12/12 0840) Rubella: 10.70 (08/08 1232) RPR: Non Reactive (12/12 0840)  HBsAg: Negative (08/08 1232)  HIV: Non Reactive (12/12 0840)  MGQ:QPYPPJKD (02/18 1100) 2 hr Glucola  abnormal Genetic screening normal Anatomy US normal  Prenatal Transfer Tool  Maternal Diabetes: Yes:  Diabetes Type:  Insulin/Medication controlled Genetic Screening: Normal Maternal Ultrasounds/Referrals: Normal Fetal Ultrasounds or other Referrals:  None Maternal Substance Abuse:  No Significant Maternal Medications:  Meds include: Other:  Labetalol, Metformin, glyburide  Significant  Maternal Lab Results: None  Past Medical History:  Diagnosis Date  . Chlamydia   . Diabetes mellitus without complication ([place])   . Gonorrhea   . PCOS (polycystic ovarian syndrome)   . Psoriasis   . Trichomonas infection   . Vaginal Pap smear, abnormal     Past Surgical History:  Procedure Laterality Date  . CHOLECYSTECTOMY      OB History  Gravida Para Term Preterm AB Living  4 1 1   2 1   SAB TAB Ectopic Multiple Live Births    2     1    # Outcome Date GA Lbr Len/2nd Weight Sex Delivery Anes PTL Lv  4 Current           3 Term 10/24/07 [redacted]w[redacted]d  3.345 kg (7 lb 6 oz) F Vag-Spont EPI N LIV  2 TAB 2005          1 TAB 2004              Social History   Socioeconomic History  . Marital status: Single    Spouse name: Not on file  . Number of children: Not on file  . Years of education: Not on file  . Highest education level: Not on file  Social Needs  . Financial resource strain: Not on file  . Food insecurity - worry: Not on file  . Food insecurity - inability: Not on file  . Transportation needs - medical: Not on file  . Transportation needs - non-medical: Not on file  Occupational History  . Not on file  Tobacco Use  . Smoking status: Former Smoker    Packs/day: 0.25    Types: Cigarettes  Last attempt to quit: 05/24/2017    Years since quitting: 0.5  . Smokeless tobacco: Never Used  Substance and Sexual Activity  . Alcohol use: No    Comment: before pregnancy  . Drug use: Yes    Types: Marijuana    Comment: last used 07/17/2017  . Sexual activity: Not Currently    Birth control/protection: None  Other Topics Concern  . Not on file  Social History Narrative  . Not on file    Family History  Problem Relation Age of Onset  . Hypertension Father   . Kidney disease Mother   . Hypertension Mother   . Leukemia Paternal Grandfather   . Hypertension Paternal Grandfather   . Diabetes Paternal Grandmother   . Hypertension Maternal Uncle   . Heart attack  Maternal Uncle   . Diabetes Paternal Aunt   . Hypertension Paternal Aunt   . COPD Paternal Uncle   . Hypertension Paternal Uncle   . Diabetes Paternal Uncle     Medications Prior to Admission  Medication Sig Dispense Refill Last Dose  . aspirin EC 81 MG tablet Take 1 tablet (81 mg total) by mouth daily. 30 tablet 6 Taking  . glyBURIDE (DIABETA) 2.5 MG tablet Take 1 tablet (2.5 mg total) by mouth at bedtime. 30 tablet 3 Taking  . labetalol (NORMODYNE) 200 MG tablet Take 1 tablet (200 mg total) by mouth 2 (two) times daily. 60 tablet 3 Taking  . metFORMIN (GLUCOPHAGE) 500 MG tablet Take 2 tablets (1,000 mg total) by mouth daily with supper. (Patient taking differently: Take 500 mg by mouth daily with supper. ) 60 tablet 1 Taking  . prenatal vitamin w/FE, FA (PRENATAL 1 + 1) 27-1 MG TABS tablet Take 1 tablet by mouth daily at 12 noon. 30 each 12 Taking    No Known Allergies  Review of Systems: Negative except for what is mentioned in HPI.  Physical Exam: LMP 03/15/2017 , BP 132/74, HR 91 CONSTITUTIONAL: Well-developed, well-nourished female in no acute distress.  HENT:  Normocephalic, atraumatic, External right and left ear normal. Oropharynx is clear and moist EYES: Conjunctivae and EOM are normal. Pupils are equal, round, and reactive to light. No scleral icterus.  NECK: Normal range of motion, supple, no masses SKIN: Skin is warm and dry. No rash noted. Not diaphoretic. No erythema. No pallor. NEUROLOGIC: Alert and oriented to person, place, and time. Normal reflexes, muscle tone coordination. No cranial nerve deficit noted. PSYCHIATRIC: Normal mood and affect. Normal behavior. Normal judgment and thought content. CARDIOVASCULAR: Normal heart rate noted, regular rhythm RESPIRATORY: Effort and breath sounds normal, no problems with respiration noted ABDOMEN: Soft, nontender, nondistended, gravid. MUSCULOSKELETAL: Normal range of motion. No edema and no tenderness. 2+ distal  pulses.  Cervical Exam: Dilatation .5cm   Effacement 60%   Station 0   Presentation: cephalic FHT:  Baseline rate 135 bpm   Variability moderate  Accelerations present   Decelerations none Contractions: Occasional   Pertinent Labs/Studies:   No results found for this or any previous visit (from the past 24 hour(s)).  Assessment : KYANNAH CLIMER is a 32 y.o. [phone] at [redacted]w[redacted]d being admitted for induction of labor due to [place]. Also has known thrombocytosis.  Plan: Labor: Induction with cytotec. Place FB when able. Analgesia as needed. She plans on getting an epidural. FWB: Reassuring fetal heart tracing.  GBS negative Delivery plan: Hopeful for vaginal delivery   Luiz Blare, DO OB Fellow Faculty Practice, [facility]  [facility] 12/21/2017, 12:14 AM

## 2017-12-22 ENCOUNTER — Encounter (HOSPITAL_COMMUNITY): Payer: Self-pay

## 2017-12-22 NOTE — Anesthesia Postprocedure Evaluation (Signed)
Anesthesia Post Note  Patient: Tonya Bell  Procedure(s) Performed: AN AD HOC LABOR EPIDURAL     Patient location during evaluation: Mother Baby Anesthesia Type: Epidural Level of consciousness: awake and alert Pain management: pain level controlled Vital Signs Assessment: post-procedure vital signs reviewed and stable Respiratory status: spontaneous breathing, nonlabored ventilation and respiratory function stable Cardiovascular status: stable Postop Assessment: no headache, no backache and epidural receding Anesthetic complications: no    Last Vitals:  Vitals:   12/22/17 0330 12/22/17 0631  BP: 125/60 121/60  Pulse: 63 67  Resp: 20 18  Temp: (!) 36.4 C 36.5 C    Last Pain:  Vitals:   12/22/17 0631  TempSrc: Oral  PainSc:    Pain Goal:                 [employer]

## 2017-12-22 NOTE — Progress Notes (Addendum)
POSTPARTUM PROGRESS NOTE  Post Partum Day 1 Subjective:  Tonya Bell is a 32 y.o. [id number] [redacted]w[redacted]d PPD#1 s/p IOL for cHTN and A2GDM.  No acute events overnight.  Pt denies problems with ambulating, voiding or po intake.  She denies nausea or vomiting.  Pain is well controlled on Tylenol. Lochia Minimal.   Objective: Blood pressure 121/60, pulse 67, temperature 97.7 F (36.5 C), temperature source Oral, resp. rate 18, height 5\' 5"  (1.651 m), weight 129.9 kg (286 lb 6.4 oz), last menstrual period 03/15/2017, unknown if currently breastfeeding.  Physical Exam:  General: alert, cooperative and no distress Lochia:normal flow Chest: no respiratory distress Heart:regular rate, distal pulses intact Abdomen: soft, nontender,  Uterine Fundus: firm, appropriately tender DVT Evaluation: No calf swelling or tenderness Extremities: no edema  Recent Labs    12/21/17 0109 12/21/17 0936  HGB 10.0* 10.2*  HCT 31.3* 31.1*    Assessment/Plan:  ASSESSMENT: Tonya Bell is a 32 y.o. [id number] [redacted]w[redacted]d s/p IOL for cHTN (Labetalol started at 30wks, stopped now) and A2GDM (metformin/glyburide). Glucose and blood pressures are currently well controlled.   Plan for discharge tomorrow  MOF: breast MOC: condoms   LOS: 1 day   Stephanie R Corder3/05/2018, 7:30 AM   I spoke with and examined patient and agree with resident/PA/SNM's note and plan of care.  Nigel Berthold, CNM 12/22/2017 7:57 AM

## 2017-12-23 MED ORDER — IBUPROFEN 600 MG PO TABS: 600.0000 mg | ORAL_TABLET | Freq: Four times a day (QID) | ORAL | 0 refills | Status: AC

## 2017-12-23 NOTE — Lactation Note (Signed)
This note was copied from a baby's chart. Lactation Consultation Note Baby 60 hrs old. Mom has a 33 yr old that she didn't BF.  Mom has "V" shaped large breast w/short shaft small nipple. Hand expression demonstrated w/colostrum easily expressed. Nipples w/stimulation will evert more, LC concerned when milk comes in may not be abe to obtain a deep latch. Shells given, encouraged mom to wear. Hand pump in rm. Demonstrated pumping prior to latching. Nipples compressible at this time.  Mom encouraged to feed baby 8-12 times/24 hours and with feeding cues.  Educated on STS, I&O, cluster feeding, breast filling, engorgement, management, milk storage, breast care. Mom states she feels good about BF at this time. Mom is giving a small amount of formula in a dropper d/t cluster feeding and mom being exhausted and needing her breast a rest. Mom states it help the baby not be so hungry. Discussed supply and demand. [place] brochure given w/resources, support groups and [place] services.  Patient Name: Tonya Bell Today's Date: 12/23/2017 Reason for consult: Initial assessment   Maternal Data Has patient been taught Hand Expression?: Yes Does the patient have breastfeeding experience prior to this delivery?: No  Feeding    LATCH Score       Type of Nipple: Everted at rest and after stimulation(short shaft small nipples)  Comfort (Breast/Nipple): Soft / non-tender        Interventions Interventions: Breast feeding basics reviewed;Support pillows;Position options;Breast massage;Hand express;Shells;Pre-pump if needed;Hand pump;Breast compression  Lactation Tools Discussed/Used Tools: Shells;Pump;Flanges Flange Size: 21 Shell Type: Inverted Breast pump type: Manual WIC Program: Yes Date initiated:: 12/22/17   Consult Status Consult Status: Complete Date: 12/23/17    Theodoro Kalata 12/23/2017, 4:41 AM

## 2017-12-23 NOTE — Discharge Instructions (Signed)
Postpartum Care After Vaginal Delivery °The period of time right after you deliver your newborn is called the postpartum period. °What kind of medical care will I receive? °· You may continue to receive fluids and medicines through an IV tube inserted into one of your veins. °· If an incision was made near your vagina (episiotomy) or if you had some vaginal tearing during delivery, cold compresses may be placed on your episiotomy or your tear. This helps to reduce pain and swelling. °· You may be given a squirt bottle to use when you go to the bathroom. You may use this until you are comfortable wiping as usual. To use the squirt bottle, follow these steps: °? Before you urinate, fill the squirt bottle with warm water. Do not use hot water. °? After you urinate, while you are sitting on the toilet, use the squirt bottle to rinse the area around your urethra and vaginal opening. This rinses away any urine and blood. °? You may do this instead of wiping. As you start healing, you may use the squirt bottle before wiping yourself. Make sure to wipe gently. °? Fill the squirt bottle with clean water every time you use the bathroom. °· You will be given sanitary pads to wear. °How can I expect to feel? °· You may not feel the need to urinate for several hours after delivery. °· You will have some soreness and pain in your abdomen and vagina. °· If you are breastfeeding, you may have uterine contractions every time you breastfeed for up to several weeks postpartum. Uterine contractions help your uterus return to its normal size. °· It is normal to have vaginal bleeding (lochia) after delivery. The amount and appearance of lochia is often similar to a menstrual period in the first week after delivery. It will gradually decrease over the next few weeks to a dry, yellow-brown discharge. For most women, lochia stops completely by 6-8 weeks after delivery. Vaginal bleeding can vary from woman to woman. °· Within the first few  days after delivery, you may have breast engorgement. This is when your breasts feel heavy, full, and uncomfortable. Your breasts may also throb and feel hard, tightly stretched, warm, and tender. After this occurs, you may have milk leaking from your breasts. Your health care provider can help you relieve discomfort due to breast engorgement. Breast engorgement should go away within a few days. °· You may feel more sad or worried than normal due to hormonal changes after delivery. These feelings should not last more than a few days. If these feelings do not go away after several days, speak with your health care provider. °How should I care for myself? °· Tell your health care provider if you have pain or discomfort. °· Drink enough water to keep your urine clear or pale yellow. °· Wash your hands thoroughly with soap and water for at least 20 seconds after changing your sanitary pads, after using the toilet, and before holding or feeding your baby. °· If you are not breastfeeding, avoid touching your breasts a lot. Doing this can make your breasts produce more milk. °· If you become weak or lightheaded, or you feel like you might faint, ask for help before: °? Getting out of bed. °? Showering. °· Change your sanitary pads frequently. Watch for any changes in your flow, such as a sudden increase in volume, a change in color, the passing of large blood clots. If you pass a blood clot from your vagina, save it   to show to your health care provider. Do not flush blood clots down the toilet without having your health care provider look at them. °· Make sure that all your vaccinations are up to date. This can help protect you and your baby from getting certain diseases. You may need to have immunizations done before you leave the hospital. °· If desired, talk with your health care provider about methods of family planning or birth control (contraception). °How can I start bonding with my baby? °Spending as much time as  possible with your baby is very important. During this time, you and your baby can get to know each other and develop a bond. Having your baby stay with you in your room (rooming in) can give you time to get to know your baby. Rooming in can also help you become comfortable caring for your baby. Breastfeeding can also help you bond with your baby. °How can I plan for returning home with my baby? °· Make sure that you have a car seat installed in your vehicle. °? Your car seat should be checked by a certified car seat installer to make sure that it is installed safely. °? Make sure that your baby fits into the car seat safely. °· Ask your health care provider any questions you have about caring for yourself or your baby. Make sure that you are able to contact your health care provider with any questions after leaving the hospital. °This information is not intended to replace advice given to you by your health care provider. Make sure you discuss any questions you have with your health care provider. °Document Released: 07/31/2007 Document Revised: 03/07/2016 Document Reviewed: 09/07/2015 °Elsevier Interactive Patient Education © 2018 Elsevier Inc. ° °

## 2017-12-23 NOTE — Discharge Summary (Signed)
OB Discharge Summary     Patient Name: Tonya Bell DOB: September 05, 1986 MRN: [id number]  Date of admission: 12/21/2017 Delivering MD: Christin Fudge   Date of discharge: 12/23/2017  Admitting diagnosis: INDUCTION Intrauterine pregnancy: [redacted]w[redacted]d     Secondary diagnosis:  Principal Problem:   SVD (spontaneous vaginal delivery) Active Problems:   Supervision of high risk pregnancy, antepartum   Smoker   Marijuana use   Chronic hypertension during pregnancy, antepartum   Gestational diabetes mellitus, class A2  Additional problems: none     Discharge diagnosis: Term Pregnancy Delivered, CHTN and GDM A2                                                                                               Post partum procedures:none  Augmentation: AROM, Pitocin, Cytotec and Foley Balloon  Complications: None  Hospital course:  Induction of Labor With Vaginal Delivery   32 y.o. yo [id number] at [redacted]w[redacted]d was admitted to the hospital 12/21/2017 for induction of labor.  Indication for induction: chronic HTN. S/p IOL with FB, cytotec, AROM, oxytocin. Patient had an uncomplicated labor course as follows: Membrane Rupture Time/Date: 1:42 PM ,12/21/2017   Intrapartum Procedures: Episiotomy: None [1]                                         Lacerations:  1st degree [2];Perineal [11]  Patient had delivery of a Viable infant.  Information for the patient's newborn:  Jaqulyn, Chancellor [[id number]]  Delivery Method: Vaginal, Spontaneous(Filed from Delivery Summary)   12/21/2017  Details of delivery can be found in separate delivery note.  Patient had a routine postpartum course. Patient is discharged home 12/23/17.  Physical exam  Vitals:   12/22/17 0631 12/22/17 1300 12/22/17 1840 12/23/17 0616  BP: 121/60  113/66 135/68  Pulse: 67  73 83  Resp: 18  19 16   Temp: 97.7 F (36.5 C)  97.7 F (36.5 C) 98.4 F (36.9 C)  TempSrc: Oral  Oral Oral  SpO2:    100%  Weight:  281 lb 0.6 oz (127.5 kg)     Height:       General: alert, cooperative and no distress Lochia: appropriate Uterine Fundus: firm Incision: N/A DVT Evaluation: No evidence of DVT seen on physical exam. Labs: Lab Results  Component Value Date   WBC 11.0 (H) 12/21/2017   HGB 10.2 (L) 12/21/2017   HCT 31.1 (L) 12/21/2017   MCV 83.8 12/21/2017   PLT 574 (H) 12/21/2017   CMP Latest Ref Rng & Units 09/27/2017  Glucose 65 - 99 mg/dL 130(H)  BUN 6 - 20 mg/dL 5(L)  Creatinine 0.57 - 1.00 mg/dL 0.55(L)  Sodium 134 - 144 mmol/L 138  Potassium 3.5 - 5.2 mmol/L 4.1  Chloride 96 - 106 mmol/L 105  CO2 20 - 29 mmol/L 20  Calcium 8.7 - 10.2 mg/dL 8.9  Total Protein 6.0 - 8.5 g/dL 6.3  Total Bilirubin 0.0 - 1.2 mg/dL 0.2  Alkaline Phos 39 - 117 IU/L 37(L)  AST 0 - 40 IU/L 12  ALT 0 - 32 IU/L 11    Discharge instruction: per After Visit Summary and "Baby and Me Booklet".  After visit meds:  Allergies as of 12/23/2017   No Known Allergies     Medication List    STOP taking these medications   glyBURIDE 2.5 MG tablet Commonly known as:  DIABETA   labetalol 200 MG tablet Commonly known as:  NORMODYNE   metFORMIN 500 MG tablet Commonly known as:  GLUCOPHAGE     TAKE these medications   ibuprofen 600 MG tablet Commonly known as:  ADVIL,MOTRIN Take 1 tablet (600 mg total) by mouth every 6 (six) hours.   prenatal vitamin w/FE, FA 27-1 MG Tabs tablet Take 1 tablet by mouth daily at 12 noon.       Diet: routine diet  Activity: Advance as tolerated. Pelvic rest for 6 weeks.   Outpatient follow up: 1-week BP check, 6-week PPV and 2-hr GTT Follow up Appt: Future Appointments  Date Time Provider [place]  12/29/2017 10:15 AM Roma Schanz, CNM [facility]  02/07/2018  9:00 AM [facility]  02/07/2018  9:30 AM Cresenzo-Dishmon, Joaquim Lai, CNM [facility]   Follow up Visit:No Follow-up on file.  Postpartum contraception: Condoms  Newborn Data: Live born  female  Birth Weight: 6 lb 6.7 oz (2910 g) APGAR: 8, 9  Newborn Delivery   Birth date/time:  12/21/2017 20:14:00 Delivery type:  Vaginal, Spontaneous     Baby Feeding: Breast Disposition:home with mother   12/23/2017 Gailen Shelter, MD

## 2017-12-23 NOTE — Clinical Social Work Maternal (Addendum)
CLINICAL SOCIAL WORK MATERNAL/CHILD NOTE  Patient Details  Name: Tonya Bell MRN: [id number] Date of Birth: 05/21/1986  Date:  12/23/2017  Clinical Social Worker Initiating Note:  Carol Watson, MSW, LCSWA Date/Time: Initiated:  12/23/17/      Child's Name:  Tonya Bell   Biological Parents:  Mother, Father   Need for Interpreter:  None   Reason for Referral:  Current Substance Use/Substance Use During Pregnancy    Address:  [address] [place] [address]    Phone number:  [phone] (home)     Additional phone number:  Household Members/Support Persons (HM/SP):   Household Member/Support Person 1, Household Member/Support Person 2   HM/SP Name Relationship DOB or Age  HM/SP -1 Tonya Bell Father of baby 09/20/89  HM/SP -2 Maternal Grandmother      HM/SP -3        HM/SP -4        HM/SP -5        HM/SP -6        HM/SP -7        HM/SP -8          Natural Supports (not living in the home):  Parent, Extended Family   Professional Supports:     Employment: Unemployed   Type of Work:     Education:  Some College   Homebound arranged:    Financial Resources:  Medicaid   Other Resources:  WIC, Food Stamps    Cultural/Religious Considerations Which May Impact Care: N/A  Strengths:  Ability to meet basic needs , Home prepared for child    Psychotropic Medications:         Pediatrician:       Pediatrician List:   [place]    [place]    [place]    [place]    [place]    [place]      Pediatrician Fax Number:    Risk Factors/Current Problems:      Cognitive State:  Alert , Able to Concentrate , Linear Thinking    Mood/Affect:  Calm , Interested , Relaxed , Comfortable    CSW Assessment: CSW met with patient and newborn at bedside to discuss history of substance abuse. Patient's FOB, Tonya was present but was asked to step out for interaction with patient, Tonya exited without any  issues. CSW spoke with patient regarding marijuana use during pregnancy, patient stated that her last use was early December 2018 and that was using because she couldn't stop. Patient was informed that the newborn's initial urine screen was negative but was educated on cord testing. Patient stated that she was not concerned for the results of the cord tissue testing. Patient stated that she had a brand new car seat and bassinet at home for safe sleeping, she receives WIC and Food Stamps and feels bonded with newborn. Patient lives with her maternal grandmother and her son Tonya Bell (DOB: 10/24/07). No prior CPS involvement. CSW educated patient on process for obtaining drug screen results from cord tissue sample and that it would take approximately five days for the final results. Patient declined the need for substance abuse resources.  CSW Plan/Description:  No Further Intervention Required/No Barriers to Discharge, Sudden Infant Death Syndrome (SIDS) Education, Perinatal Mood and Anxiety Disorder (PMADs) Education, Hospital Drug Screen Policy Information, Other Information/Referral to Community Resources, CSW Will Continue to Monitor Umbilical Cord Tissue Drug Screen Results and Make Report if Warranted   Carol L Watson, LCSWA 12/23/2017,   12:12 PM

## 2017-12-29 ENCOUNTER — Ambulatory Visit (INDEPENDENT_AMBULATORY_CARE_PROVIDER_SITE_OTHER): Payer: Medicaid Other | Admitting: Women's Health

## 2017-12-29 ENCOUNTER — Encounter: Payer: Self-pay | Admitting: Women's Health

## 2017-12-29 VITALS — BP 188/98 | HR 62 | Ht 65.0 in | Wt 285.0 lb

## 2017-12-29 DIAGNOSIS — Z0130 Encounter for examination of blood pressure without abnormal findings: Secondary | ICD-10-CM

## 2017-12-29 DIAGNOSIS — I10 Essential (primary) hypertension: Secondary | ICD-10-CM

## 2017-12-29 MED ORDER — LABETALOL HCL 300 MG PO TABS: 300.0000 mg | ORAL_TABLET | Freq: Three times a day (TID) | ORAL | 3 refills | Status: DC

## 2017-12-29 MED ORDER — HYDROCHLOROTHIAZIDE 25 MG PO TABS
25.0000 mg | ORAL_TABLET | Freq: Every day | ORAL | 3 refills | Status: DC
Start: 2017-12-29 — End: 2018-02-07

## 2017-12-29 NOTE — Progress Notes (Signed)
   GYN VISIT Patient name: Tonya Bell MRN [id number]  Date of birth: [birth year] Chief Complaint:   Blood Pressure Check  History of Present Illness:   Tonya Bell is a 32 y.o. [phone] African American female 8d s/p SVB after IOL for CHTN and A2DM being seen today for bp check.  Was not on any bp meds prior to pregnancy, started on Labetalol 200mg  BID during pregnancy, was not d/c'd on bp meds. Denies ha, visual changes, ruq/epigastric pain, n/v. Breastfeeding. Emotional, cries a lot. Denies dep/anx. Denies SI/HI/II. Just 'easily emotional'. Reports she is coping well.   No LMP recorded. Review of Systems:   Pertinent items are noted in HPI Denies fever/chills, dizziness, headaches, visual disturbances, fatigue, shortness of breath, chest pain, abdominal pain, vomiting, abnormal vaginal discharge/itching/odor/irritation, problems with periods, bowel movements, urination, or intercourse unless otherwise stated above.  Pertinent History Reviewed:  Reviewed past medical,surgical, social, obstetrical and family history.  Reviewed problem list, medications and allergies. Physical Assessment:   Vitals:   12/29/17 1018 12/29/17 1026  BP: (!) 178/90 (!) 188/98  Pulse: 62   Weight: 285 lb (129.3 kg)   Height: 5\' 5"  (1.651 m)   Body mass index is 47.43 kg/m.       Physical Examination:   General appearance: alert, well appearing, and in no distress  Mental status: alert, oriented to person, place, and time  Skin: warm & dry   Cardiovascular: normal heart rate noted  Respiratory: normal respiratory effort, no distress  Abdomen: soft, non-tender   Pelvic: examination not indicated  Extremities: no edema, DTRs 2+, no clonus  No results found for this or any previous visit (from the past 24 hour(s)).  Assessment & Plan:  1) 8d s/p SVB after IOL for CHTN, A2DM> breastfeeding  2) CHTN> currently uncontrolled. Discussed w/ [facility], does not feel is pp pre-e, does not need to be  re-admitted> recommends Labetalol 300mg  TID and HCTZ 25mg  daily. Discussed pre-e warning s/s with pt, reasons to seek care. Make sure to take both bp meds at least 1hr before coming Mon for bp check  Meds:  Meds ordered this encounter  Medications  . labetalol (NORMODYNE) 300 MG tablet    Sig: Take 1 tablet (300 mg total) by mouth 3 (three) times daily.    Dispense:  90 tablet    Refill:  3    Order Specific Question:   Supervising Provider    Answer:   Elonda Husky, LUTHER H [[id number]]  . hydrochlorothiazide (HYDRODIURIL) 25 MG tablet    Sig: Take 1 tablet (25 mg total) by mouth daily.    Dispense:  30 tablet    Refill:  3    Order Specific Question:   Supervising Provider    Answer:   Elonda Husky, LUTHER H [[id number]]    No orders of the defined types were placed in this encounter.   Return for Monday for bp check w/ nurse, then 2wks for f/u w/ provider.  [place], [facility] 12/29/2017 12:47 PM

## 2017-12-29 NOTE — Patient Instructions (Signed)
Call the office [phone]) or go to [facility] hospital for these signs of pre-eclampsia:  Severe headache that does not go away with Tylenol  Visual changes- seeing spots, double, blurred vision  Pain under your right breast or upper abdomen that does not go away with Tums or heartburn medicine  Nausea and/or vomiting  Severe swelling in your hands, feet, and face

## 2018-01-01 ENCOUNTER — Ambulatory Visit (INDEPENDENT_AMBULATORY_CARE_PROVIDER_SITE_OTHER): Payer: Medicaid Other | Admitting: *Deleted

## 2018-01-01 ENCOUNTER — Encounter: Payer: Self-pay | Admitting: *Deleted

## 2018-01-01 VITALS — BP 160/98 | Ht 65.0 in | Wt 277.0 lb

## 2018-01-01 DIAGNOSIS — Z0130 Encounter for examination of blood pressure without abnormal findings: Secondary | ICD-10-CM

## 2018-01-01 NOTE — Progress Notes (Signed)
Pt here for BP check. BP today was 160/98. Pt taking HCTZ 25 mg once daily, hasn't taken that this am. Also on Labetalol 200 mg TID. Was prescribed 300 mg, but pharmacy didn't have that so she should be able to pick that up today. Tish spoke with Maudie Mercury, CNM and was advised to come back Wednesday for BP check. Take med 1 hour before coming. Pt voiced understanding. [place]

## 2018-01-03 ENCOUNTER — Encounter: Payer: Self-pay | Admitting: *Deleted

## 2018-01-03 ENCOUNTER — Ambulatory Visit (INDEPENDENT_AMBULATORY_CARE_PROVIDER_SITE_OTHER): Payer: Medicaid Other | Admitting: *Deleted

## 2018-01-03 VITALS — BP 136/76

## 2018-01-03 DIAGNOSIS — Z0130 Encounter for examination of blood pressure without abnormal findings: Secondary | ICD-10-CM

## 2018-01-03 NOTE — Patient Instructions (Signed)
Tips To Increase Milk Supply  Lots of water! Enough so that your urine is clear  Plenty of calories, if you're not getting enough calories, your milk supply can decrease  Breastfeed/pump often, every 2-3 hours x 20-30mins  Fenugreek 3 pills 3 times a day, this may make your urine smell like maple syrup  Mother's Milk Tea  Lactation cookies, google for the recipe  Real oatmeal   

## 2018-01-03 NOTE — Progress Notes (Signed)
Pt here for BP check. BP today was 136/76. Pt complains of breast milk drying up due to HCTZ. I spoke with Maudie Mercury, CNM and she advised to keep taking HCTZ. Info given to pt on tips to increase breast milk production. Keep appt for 4/1 and stop HCTZ 2 days before appt. Cancel PN2 for 4/24. Pt voiced understanding. [place]

## 2018-01-15 ENCOUNTER — Encounter: Payer: Self-pay | Admitting: Women's Health

## 2018-01-15 ENCOUNTER — Ambulatory Visit (INDEPENDENT_AMBULATORY_CARE_PROVIDER_SITE_OTHER): Payer: Medicaid Other | Admitting: Women's Health

## 2018-01-15 VITALS — BP 140/80 | HR 71 | Ht 65.0 in | Wt 275.0 lb

## 2018-01-15 DIAGNOSIS — Z0130 Encounter for examination of blood pressure without abnormal findings: Secondary | ICD-10-CM | POA: Diagnosis not present

## 2018-01-15 DIAGNOSIS — I10 Essential (primary) hypertension: Secondary | ICD-10-CM | POA: Diagnosis not present

## 2018-01-15 NOTE — Progress Notes (Signed)
   GYN VISIT Patient name: Tonya Bell MRN [id number]  Date of birth: 1986/06/19 Chief Complaint:   Blood Pressure Check  History of Present Illness:   Tonya Bell is a 32 y.o. [phone] African American female 3wks s/p SVB after IOL for CHTN and A2DM being seen today for bp check. Was not on any bp meds prior to pregnancy, started on Labetalol 200mg  BID during pregnancy. Was not d/c'd on meds from hospital. Was started on Labetalol 300mg  TID and HCTZ 25mg  12/29/17 visit for bp check. She stopped taking HCTZ 2 days ago as directed. Breastfeeding. Doing well, no complaints.     No LMP recorded. Review of Systems:   Pertinent items are noted in HPI Denies fever/chills, dizziness, headaches, visual disturbances, fatigue, shortness of breath, chest pain, abdominal pain, vomiting, abnormal vaginal discharge/itching/odor/irritation, problems with periods, bowel movements, urination, or intercourse unless otherwise stated above.  Pertinent History Reviewed:  Reviewed past medical,surgical, social, obstetrical and family history.  Reviewed problem list, medications and allergies. Physical Assessment:   Vitals:   01/15/18 0922  BP: 140/80  Pulse: 71  Weight: 275 lb (124.7 kg)  Height: 5\' 5"  (1.651 m)  Body mass index is 45.76 kg/m.       Physical Examination:   General appearance: alert, well appearing, and in no distress  Mental status: alert, oriented to person, place, and time  Skin: warm & dry   Cardiovascular: normal heart rate noted  Respiratory: normal respiratory effort, no distress  Abdomen: soft, non-tender   Pelvic: examination not indicated  Extremities: no edema   No results found for this or any previous visit (from the past 24 hour(s)).  Assessment & Plan:  1) 3wks s/p SVB, breastfeeding  2) CHTN, BP check> was not on meds prior to pregnancy. Do not restart HCTZ. Stop Labetalol 2d before pp visit.   Meds: No orders of the defined types were placed in this  encounter.   No orders of the defined types were placed in this encounter.   Return for As scheduled 4/24 for postpartum visit., will need 2hr GTT @ 12wks pp  Roma Schanz CNM, [facility] 01/15/2018 10:40 AM

## 2018-01-15 NOTE — Patient Instructions (Signed)
Stop taking Labetalol 2 days before your postpartum visit

## 2018-02-07 ENCOUNTER — Encounter: Payer: Self-pay | Admitting: Advanced Practice Midwife

## 2018-02-07 ENCOUNTER — Ambulatory Visit (INDEPENDENT_AMBULATORY_CARE_PROVIDER_SITE_OTHER): Payer: Medicaid Other | Admitting: Advanced Practice Midwife

## 2018-02-07 ENCOUNTER — Other Ambulatory Visit: Payer: Medicaid Other

## 2018-02-07 MED ORDER — AMLODIPINE BESYLATE 5 MG PO TABS: 5.0000 mg | ORAL_TABLET | Freq: Every day | ORAL | 1 refills | Status: DC

## 2018-02-07 NOTE — Patient Instructions (Signed)
STOP taking BP med (Norvasac) a few days before your appointment in 6 weeks.   DON'T take the labetalol or the HCTZ anymore.

## 2018-02-07 NOTE — Progress Notes (Signed)
Tonya Bell is a 32 y.o. who presents for a postpartum visit. She is 7 weeks postpartum following a spontaneous vaginal delivery. I have fully reviewed the prenatal and intrapartum course. The delivery was at 39.2 gestational weeks.  Anesthesia: epidural.Pt has CHTN , started on meds middle to end of pregnancy, so possiblity exists that she won't need continued meds.  Had A2DM Postpartum course has been uneventful. Baby's course has been uneventful. Baby is feeding by bottle. Bleeding: had a period 4/12.. Bowel function is normal. Bladder function is normal. Patient is not sexually active. Contraception method is condoms. Postpartum depression screening: negative.   Current Outpatient Medications:  .  prenatal vitamin w/FE, FA (PRENATAL 1 + 1) 27-1 MG TABS tablet, Take 1 tablet by mouth daily at 12 noon., Disp: 30 each, Rfl: 12 .  amLODipine (NORVASC) 5 MG tablet, Take 1 tablet (5 mg total) by mouth daily., Disp: 30 tablet, Rfl: 1 .  ibuprofen (ADVIL,MOTRIN) 600 MG tablet, Take 1 tablet (600 mg total) by mouth every 6 (six) hours. (Patient not taking: Reported on 02/07/2018), Disp: 60 tablet, Rfl: 0  Review of Systems   Constitutional: Negative for fever and chills Eyes: Negative for visual disturbances Respiratory: Negative for shortness of breath, dyspnea Cardiovascular: Negative for chest pain or palpitations  Gastrointestinal: Negative for vomiting, diarrhea and constipation Genitourinary: Negative for dysuria and urgency Musculoskeletal: Negative for back pain, joint pain, myalgias  Neurological: Negative for dizziness and headaches    Objective:     Vitals:   02/07/18 0938 02/07/18 0944  BP: (!) 150/94 (!) 148/90  Pulse: 87    General:  alert, cooperative and no distress   Breasts:  negative  Lungs: Normal respiratory effort  Heart:  regular rate and rhythm  Abdomen: Soft, nontender   Vulva:  normal  Vagina: normal vagina  Cervix:  closed  Corpus: Well involuted      Rectal Exam: no hemorrhoids        Assessment:    normal postpartum exam.  Plan:   1. Contraception: condoms 2. Follow up in:  [facility] for 2hr gtt 3.  Take BP meds daily! Start Norvasc 5mg ; will check again in 6 weeks to see if she needs to continue

## 2018-02-13 ENCOUNTER — Other Ambulatory Visit: Payer: Medicaid Other

## 2018-02-13 DIAGNOSIS — Z131 Encounter for screening for diabetes mellitus: Secondary | ICD-10-CM

## 2018-02-14 ENCOUNTER — Encounter (INDEPENDENT_AMBULATORY_CARE_PROVIDER_SITE_OTHER): Payer: Self-pay

## 2018-02-14 LAB — GLUCOSE TOLERANCE, 2 HOURS W/ 1HR
GLUCOSE, FASTING: 89 mg/dL (ref 65–91)
Glucose, 1 hour: 82 mg/dL (ref 65–179)
Glucose, 2 hour: 65 mg/dL (ref 65–152)

## 2018-03-21 ENCOUNTER — Encounter: Payer: Medicaid Other | Admitting: Advanced Practice Midwife

## 2018-03-28 ENCOUNTER — Ambulatory Visit (INDEPENDENT_AMBULATORY_CARE_PROVIDER_SITE_OTHER): Payer: Medicaid Other | Admitting: Advanced Practice Midwife

## 2018-03-28 ENCOUNTER — Encounter: Payer: Self-pay | Admitting: Advanced Practice Midwife

## 2018-03-28 VITALS — BP 160/94 | HR 65 | Wt 269.0 lb

## 2018-03-28 DIAGNOSIS — I10 Essential (primary) hypertension: Secondary | ICD-10-CM | POA: Diagnosis not present

## 2018-03-28 MED ORDER — AMLODIPINE BESYLATE 10 MG PO TABS: 10.0000 mg | ORAL_TABLET | Freq: Every day | ORAL | 10 refills | Status: DC

## 2018-03-28 MED ORDER — AMLODIPINE BESYLATE 10 MG PO TABS: 10.0000 mg | ORAL_TABLET | Freq: Every day | ORAL | 11 refills | Status: DC

## 2018-03-28 NOTE — Addendum Note (Signed)
Addended by: Christin Fudge on: 03/28/2018 01:21 PM   Modules accepted: Orders

## 2018-03-28 NOTE — Progress Notes (Signed)
[facility] Visit  Patient name: Tonya Bell MRN [id number]  Date of birth: May 23, 1986  CC & HPI:  Tonya Bell is a 32 y.o. African American female presenting today for BP check.  She started BP meds during the pregnancy, baby now 23 weeks old.  On Norvasc 5mg  daily took it today.   Pertinent History Reviewed:  Medical & Surgical Hx:   Past Medical History:  Diagnosis Date  . Chlamydia   . Diabetes mellitus without complication ([place])   . Gonorrhea   . PCOS (polycystic ovarian syndrome)   . Psoriasis   . Trichomonas infection   . Vaginal Pap smear, abnormal    Past Surgical History:  Procedure Laterality Date  . CHOLECYSTECTOMY     Family History  Problem Relation Age of Onset  . Hypertension Father   . Kidney disease Mother   . Hypertension Mother   . Leukemia Paternal Grandfather   . Hypertension Paternal Grandfather   . Diabetes Paternal Grandmother   . Hypertension Maternal Uncle   . Heart attack Maternal Uncle   . Diabetes Paternal Aunt   . Hypertension Paternal Aunt   . COPD Paternal Uncle   . Hypertension Paternal Uncle   . Diabetes Paternal Uncle     Current Outpatient Medications:  .  ibuprofen (ADVIL,MOTRIN) 600 MG tablet, Take 1 tablet (600 mg total) by mouth every 6 (six) hours., Disp: 60 tablet, Rfl: 0 .  prenatal vitamin w/FE, FA (PRENATAL 1 + 1) 27-1 MG TABS tablet, Take 1 tablet by mouth daily at 12 noon., Disp: 30 each, Rfl: 12 .  amLODipine (NORVASC) 10 MG tablet, Take 1 tablet (10 mg total) by mouth daily., Disp: 30 tablet, Rfl: 10 Social History: Reviewed -  reports that she has been smoking cigars.  She has been smoking about 0.25 packs per day. She has never used smokeless tobacco.  Review of Systems:   Constitutional: Negative for fever and chills Eyes: Negative for visual disturbances Respiratory: Negative for shortness of breath, dyspnea Cardiovascular: Negative for chest pain or palpitations  Gastrointestinal:  Negative for vomiting, diarrhea and constipation; no abdominal pain Genitourinary: Negative for dysuria and urgency, vaginal irritation or itching Musculoskeletal: Negative for back pain, joint pain, myalgias  Neurological: Negative for dizziness and headaches    Objective Findings:    Physical Examination: Vitals:   03/28/18 1014 03/28/18 1017  BP: (!) 150/90 (!) 160/94  Pulse: 65    General appearance - well appearing, and in no distress Mental status - alert, oriented to person, place, and time Chest:  Normal respiratory effort Heart - normal rate and regular rhythm Musculoskeletal:  Normal range of motion without pain Extremities:  No edema   50% or more of this visit was spent in counseling and coordination of care.  10 minutes of face to face time.    No results found for this or any previous visit (from the past 24 hour(s)).    Assessment & Plan:  A:   CHTN P:  Increase Norvasc to 10mg ;  Pt to get home BP cuff and monitor. Let me k now if >140/90   Return for If you have any problems.  Joaquim Lai Cresenzo-Dishmon CNM 03/28/2018 10:54 AM

## 2024-10-21 ENCOUNTER — Encounter: Payer: Self-pay | Admitting: Advanced Practice Midwife

## 2024-10-21 ENCOUNTER — Ambulatory Visit (INDEPENDENT_AMBULATORY_CARE_PROVIDER_SITE_OTHER): Admitting: Advanced Practice Midwife

## 2024-10-21 ENCOUNTER — Other Ambulatory Visit (HOSPITAL_COMMUNITY)
Admission: RE | Admit: 2024-10-21 | Discharge: 2024-10-21 | Disposition: A | Source: Ambulatory Visit | Attending: Advanced Practice Midwife | Admitting: Advanced Practice Midwife

## 2024-10-21 VITALS — BP 190/100 | HR 65 | Ht 65.0 in | Wt 293.0 lb

## 2024-10-21 DIAGNOSIS — I10 Essential (primary) hypertension: Secondary | ICD-10-CM

## 2024-10-21 DIAGNOSIS — Z131 Encounter for screening for diabetes mellitus: Secondary | ICD-10-CM | POA: Diagnosis not present

## 2024-10-21 DIAGNOSIS — Z01419 Encounter for gynecological examination (general) (routine) without abnormal findings: Secondary | ICD-10-CM | POA: Diagnosis present

## 2024-10-21 DIAGNOSIS — Z13220 Encounter for screening for lipoid disorders: Secondary | ICD-10-CM

## 2024-10-21 DIAGNOSIS — Z113 Encounter for screening for infections with a predominantly sexual mode of transmission: Secondary | ICD-10-CM

## 2024-10-21 DIAGNOSIS — Z1329 Encounter for screening for other suspected endocrine disorder: Secondary | ICD-10-CM | POA: Diagnosis not present

## 2024-10-21 MED ORDER — AMLODIPINE BESYLATE 10 MG PO TABS: 10.0000 mg | ORAL_TABLET | Freq: Every day | ORAL | 1 refills | Status: AC

## 2024-10-21 NOTE — Progress Notes (Signed)
 [address] 39 y.o.  Vitals:   10/21/24 0846 10/21/24 0916  BP: (!) 167/100 (!) 190/100  Pulse: 69 65     Filed Weights   10/21/24 0846  Weight: 293 lb (132.9 kg)    Past Medical History: Past Medical History:  Diagnosis Date   Chlamydia    Diabetes mellitus without complication (HCC)    Gonorrhea    Hypertension    PCOS (polycystic ovarian syndrome)    Psoriasis    Trichomonas infection    Vaginal Pap smear, abnormal     Past Surgical History: Past Surgical History:  Procedure Laterality Date   CHOLECYSTECTOMY      Family History: Family History  Problem Relation Age of Onset   Leukemia Paternal Grandfather    Hypertension Paternal Grandfather    Diabetes Paternal Grandmother    Hypertension Father    Kidney disease Mother    Hypertension Mother    Hypertension Maternal Uncle    Heart attack Maternal Uncle    Diabetes Paternal Aunt    Hypertension Paternal Aunt    COPD Paternal Uncle    Hypertension Paternal Uncle    Diabetes Paternal Uncle     Social History: Social History[1]  Allergies: Allergies[2]   Current Medications[3]      10/21/2024    8:41 AM 05/24/2017   11:30 AM 05/04/2017   10:35 AM  Depression screen PHQ 2/9  Decreased Interest 1 0 0  Down, Depressed, Hopeless 0 0 0  PHQ - 2 Score 1 0 0  Altered sleeping 1 0   Tired, decreased energy 1 1   Change in appetite 1 0   Feeling bad or failure about yourself  0 0   Trouble concentrating 0 0   Moving slowly or fidgety/restless 0 0   Suicidal thoughts 0 0   PHQ-9 Score 4 1       Data saved with a previous flowsheet row definition        10/21/2024    8:41 AM  GAD 7 : Generalized Anxiety Score  Nervous, Anxious, on Edge 0  Control/stop worrying 1  Worry too much - different things 1  Trouble relaxing 1  Restless 1  Easily annoyed or irritable 1  Afraid - awful might happen 0  Total GAD 7 Score 5      Mental Health score  Negative Follow up:  N/A   Upstream -  10/21/24 0847       Pregnancy Intention Screening   Does the patient want to become pregnant in the next year? No    Does the patient's partner want to become pregnant in the next year? No    Would the patient like to discuss contraceptive options today? No      Contraception Wrap Up   Current Method No Contraceptive Precautions    End Method No Contraception Precautions    Contraception Counseling Provided No          The pregnancy intention screening data noted above was reviewed.   History of Present Illness: Here for pap and physical.  Uses condoms sometimesfor birth control. Would be OK if got pregnant. Periods are regular and 7 days.  Grandmother passed away a few weeks ago, would attribute high BP to stress. However, she was diagnosed w/HTN during 2018 pregnancy, and chart review reveals BPs elevated 3 months after delivery. PCP was Dr. Alphonsa, hasn't been in years. Has an appt at the end of this month.   Prior cytology:  Component Value Date/Time   DIAGPAP  05/24/2017 0000    NEGATIVE FOR INTRAEPITHELIAL LESIONS OR MALIGNANCY.   ADEQPAP  05/24/2017 0000    Satisfactory for evaluation  endocervical/transformation zone component ABSENT.      Review of Systems   Patient denies any headaches, blurred vision, shortness of breath, chest pain, abdominal pain, problems with bowel movements, urination, or intercourse.   Physical Exam: General:  Well developed, well nourished, no acute distress Skin:  Warm and dry Neck:  Midline trachea Lungs; Clear to auscultation bilaterally Breast:  No dominant palpable mass, retraction, or nipple discharge Cardiovascular: Regular rate and rhythm Abdomen:  Soft, non tender, no hepatosplenomegaly Pelvic:  External genitalia is normal in appearance.  The vagina is normal in appearance.  The cervix is bulbous.  Uterus is felt to be normal size, shape, and contour.  No adnexal masses or tenderness noted. Exam limited by habitus   Extremities:  No swelling or varicosities noted  Chaperone: Rutherford Rover  Impression: Normal GYN exam   HTN:  recheck BP 190/100. Pt agrees to restart Norvasc  (rx sent in).  Defer to PCPs medicaiton recommendation     If pap normal, repeat in 3 years  Orders Placed This Encounter  Procedures   CBC   Comprehensive metabolic panel with GFR    Has the patient fasted?:   Yes   Hemoglobin A1c   Lipid panel    Has the patient fasted?:   Yes   TSH   HIV antibody (with reflex)   RPR             [1]  Social History Tobacco Use   Smoking status: Some Days    Current packs/day: 0.00    Average packs/day: 0.3 packs/day    Types: Cigars, Cigarettes    Last attempt to quit: 05/24/2017    Years since quitting: 7.4   Smokeless tobacco: Never   Tobacco comments:    Tobacco products as of yesterday  Vaping Use   Vaping status: Never Used  Substance Use Topics   Alcohol use: Yes   Drug use: Yes    Types: Marijuana  [2] No Known Allergies [3]  Current Outpatient Medications:    amLODipine  (NORVASC ) 10 MG tablet, Take 1 tablet (10 mg total) by mouth daily., Disp: 30 tablet, Rfl: 1

## 2024-10-21 NOTE — Addendum Note (Signed)
 Addended by: ILEAN RUTHERFORD HERO on: 10/21/2024 09:38 AM   Modules accepted: Orders

## 2024-10-22 ENCOUNTER — Other Ambulatory Visit (HOSPITAL_COMMUNITY)
Admission: RE | Admit: 2024-10-22 | Discharge: 2024-10-22 | Disposition: A | Discharge status: Home / Self Care | Source: Ambulatory Visit | Attending: Advanced Practice Midwife | Admitting: Advanced Practice Midwife

## 2024-10-22 DIAGNOSIS — Z13220 Encounter for screening for lipoid disorders: Secondary | ICD-10-CM | POA: Insufficient documentation

## 2024-10-22 DIAGNOSIS — Z01419 Encounter for gynecological examination (general) (routine) without abnormal findings: Secondary | ICD-10-CM | POA: Diagnosis present

## 2024-10-22 DIAGNOSIS — Z1329 Encounter for screening for other suspected endocrine disorder: Secondary | ICD-10-CM | POA: Insufficient documentation

## 2024-10-22 DIAGNOSIS — Z113 Encounter for screening for infections with a predominantly sexual mode of transmission: Secondary | ICD-10-CM | POA: Diagnosis not present

## 2024-10-22 DIAGNOSIS — Z131 Encounter for screening for diabetes mellitus: Secondary | ICD-10-CM | POA: Insufficient documentation

## 2024-10-22 LAB — COMPREHENSIVE METABOLIC PANEL WITH GFR
ALT: 18 U/L (ref 0–44)
AST: 18 U/L (ref 15–41)
Albumin: 4.3 g/dL (ref 3.5–5.0)
Alkaline Phosphatase: 50 U/L (ref 38–126)
Anion gap: 11 (ref 5–15)
BUN: 8 mg/dL (ref 6–20)
CO2: 25 mmol/L (ref 22–32)
Calcium: 8.9 mg/dL (ref 8.9–10.3)
Chloride: 105 mmol/L (ref 98–111)
Creatinine, Ser: 0.86 mg/dL (ref 0.44–1.00)
GFR, Estimated: >60 mL/min
Glucose, Bld: 95 mg/dL (ref 70–99)
Potassium: 3.8 mmol/L (ref 3.5–5.1)
Sodium: 140 mmol/L (ref 135–145)
Total Bilirubin: 0.5 mg/dL (ref 0.0–1.2)
Total Protein: 7.2 g/dL (ref 6.5–8.1)

## 2024-10-22 LAB — CBC
HCT: 39.2 % (ref 36.0–46.0)
Hemoglobin: 12.8 g/dL (ref 12.0–15.0)
MCH: 30.9 pg (ref 26.0–34.0)
MCHC: 32.7 g/dL (ref 30.0–36.0)
MCV: 94.7 fL (ref 80.0–100.0)
Platelets: 569 K/uL — ABNORMAL HIGH (ref 150–400)
RBC: 4.14 MIL/uL (ref 3.87–5.11)
RDW: 14.6 % (ref 11.5–15.5)
WBC: 9.4 K/uL (ref 4.0–10.5)
nRBC: 0 % (ref 0.0–0.2)

## 2024-10-22 LAB — LIPID PANEL
Cholesterol: 167 mg/dL (ref 0–200)
HDL: 51 mg/dL
LDL Cholesterol: 105 mg/dL — ABNORMAL HIGH (ref 0–99)
Total CHOL/HDL Ratio: 3.3 ratio
Triglycerides: 54 mg/dL
VLDL: 11 mg/dL (ref 0–40)

## 2024-10-22 LAB — HEMOGLOBIN A1C
Hgb A1c MFr Bld: 5.2 % (ref 4.8–5.6)
Mean Plasma Glucose: 102.54 mg/dL

## 2024-10-22 LAB — HIV ANTIBODY (ROUTINE TESTING W REFLEX): HIV Screen 4th Generation wRfx: NONREACTIVE

## 2024-10-22 LAB — TSH: TSH: 1.41 u[IU]/mL (ref 0.350–4.500)

## 2024-10-23 LAB — SYPHILIS: RPR W/REFLEX TO RPR TITER AND TREPONEMAL ANTIBODIES, TRADITIONAL SCREENING AND DIAGNOSIS ALGORITHM: RPR Ser Ql: NONREACTIVE

## 2024-10-24 ENCOUNTER — Ambulatory Visit: Payer: Self-pay | Admitting: Advanced Practice Midwife

## 2024-10-25 LAB — CYTOLOGY - PAP
Chlamydia: NEGATIVE
Comment: NEGATIVE
Comment: NEGATIVE
Comment: NORMAL
Diagnosis: NEGATIVE
Diagnosis: REACTIVE
High risk HPV: NEGATIVE
Neisseria Gonorrhea: NEGATIVE

## 2024-11-14 ENCOUNTER — Ambulatory Visit: Payer: Self-pay

## 2025-04-14 ENCOUNTER — Ambulatory Visit: Payer: Self-pay
